# Patient Record
Sex: Male | Born: 1945
Health system: Southern US, Community
[De-identification: ages and names within clinical notes are randomized; demographics above are authoritative.]

## PROBLEM LIST (undated history)

## (undated) DIAGNOSIS — E119 Type 2 diabetes mellitus without complications: Secondary | ICD-10-CM

## (undated) DIAGNOSIS — I10 Essential (primary) hypertension: Secondary | ICD-10-CM

## (undated) DIAGNOSIS — E785 Hyperlipidemia, unspecified: Secondary | ICD-10-CM

## (undated) HISTORY — PX: COLONOSCOPY: SHX174

## (undated) HISTORY — PX: SHOULDER SURGERY: SHX246

## (undated) HISTORY — PX: POLYPECTOMY: SHX149

## (undated) HISTORY — PX: APPENDECTOMY: SHX54

## (undated) HISTORY — DX: Hyperlipidemia, unspecified: E78.5

## (undated) HISTORY — DX: Essential (primary) hypertension: I10

## (undated) HISTORY — PX: EYE SURGERY: SHX253

## (undated) HISTORY — DX: Type 2 diabetes mellitus without complications: E11.9

## (undated) HISTORY — PX: TONSILLECTOMY: SUR1361

---

## 2001-01-27 ENCOUNTER — Encounter: Payer: Self-pay | Admitting: Gastroenterology

## 2005-06-05 ENCOUNTER — Ambulatory Visit (HOSPITAL_COMMUNITY): Admission: RE | Admit: 2005-06-05 | Discharge: 2005-06-05 | Payer: Self-pay | Admitting: Thoracic Surgery

## 2006-10-12 ENCOUNTER — Encounter: Admission: RE | Admit: 2006-10-12 | Discharge: 2006-10-12 | Payer: Self-pay | Admitting: Thoracic Surgery

## 2006-10-12 ENCOUNTER — Ambulatory Visit: Payer: Self-pay | Admitting: Thoracic Surgery

## 2008-02-10 ENCOUNTER — Ambulatory Visit: Payer: Self-pay | Admitting: Gastroenterology

## 2008-02-10 DIAGNOSIS — Z860100 Personal history of colon polyps, unspecified: Secondary | ICD-10-CM | POA: Insufficient documentation

## 2008-02-10 DIAGNOSIS — E119 Type 2 diabetes mellitus without complications: Secondary | ICD-10-CM

## 2008-02-10 DIAGNOSIS — K5730 Diverticulosis of large intestine without perforation or abscess without bleeding: Secondary | ICD-10-CM | POA: Insufficient documentation

## 2008-03-15 ENCOUNTER — Encounter: Payer: Self-pay | Admitting: Gastroenterology

## 2008-03-15 ENCOUNTER — Ambulatory Visit: Payer: Self-pay | Admitting: Gastroenterology

## 2008-03-19 ENCOUNTER — Encounter: Payer: Self-pay | Admitting: Gastroenterology

## 2010-08-19 NOTE — Letter (Signed)
October 12, 2006   Gaspar Garbe, M.D.  [address]  [place], [place] [address]   Re:  Richard Singleton, Richard Singleton                DOB:  Oct 07, 1945   Dear Dr. Wylene Simmer:   I saw Richard Singleton back in the office today for a followup.  His CT scan 9  months since his last CT scan showed no change, so he has now been over  2 years with no change in the right lower lobe 8 mm nodule.  For this  reason I will refer him back to you.  For a followup, I would suggest  that he gets at least a chest x-ray once a year for the next several  years and if there is anything suspicious to repeat his CT scan.  I  appreciate the opportunity to seeing  Richard Singleton.   Ines Bloomer, M.D.  Electronically Signed   DPB/MEDQ  D:  10/12/2006  T:  10/13/2006  Job:  [id number]

## 2011-01-09 LAB — GLUCOSE, CAPILLARY: Glucose-Capillary: 121 mg/dL — ABNORMAL HIGH (ref 70–99)

## 2013-01-06 ENCOUNTER — Encounter: Payer: Self-pay | Admitting: Gastroenterology

## 2013-06-23 ENCOUNTER — Other Ambulatory Visit: Payer: Self-pay | Admitting: Internal Medicine

## 2013-06-23 ENCOUNTER — Encounter: Payer: Self-pay | Admitting: Gastroenterology

## 2013-06-23 DIAGNOSIS — R911 Solitary pulmonary nodule: Secondary | ICD-10-CM

## 2013-06-28 ENCOUNTER — Ambulatory Visit
Admission: RE | Admit: 2013-06-28 | Discharge: 2013-06-28 | Disposition: A | Discharge status: Home / Self Care | Payer: BC Managed Care – PPO | Source: Ambulatory Visit | Attending: Internal Medicine | Admitting: Internal Medicine

## 2013-06-28 ENCOUNTER — Other Ambulatory Visit: Payer: Self-pay | Admitting: Internal Medicine

## 2013-06-28 DIAGNOSIS — R911 Solitary pulmonary nodule: Secondary | ICD-10-CM

## 2013-08-16 ENCOUNTER — Ambulatory Visit (AMBULATORY_SURGERY_CENTER): Payer: Self-pay | Admitting: *Deleted

## 2013-08-16 VITALS — Ht 71.0 in | Wt 230.0 lb

## 2013-08-16 DIAGNOSIS — Z86010 Personal history of colonic polyps: Secondary | ICD-10-CM

## 2013-08-16 MED ORDER — MOVIPREP 100 G PO SOLR: ORAL | Status: DC

## 2013-08-16 NOTE — Progress Notes (Signed)
Patient denies any allergies to eggs or soy. Patient denies any problems with anesthesia/sedation. Patient denies any oxygen use at home and does not take any diet/weight loss medications. EMMI education assisgned to patient on colonoscopy. Patient does not have medication list, unsure of ALL names of pills. Patient denies being on ANY blood thinners. Encouraged patient to call us back with list or bring list of medications to colonoscopy procedure day. He verbalizes understanding.

## 2013-08-30 ENCOUNTER — Ambulatory Visit (AMBULATORY_SURGERY_CENTER): Payer: BC Managed Care – PPO | Admitting: Gastroenterology

## 2013-08-30 ENCOUNTER — Encounter: Payer: Self-pay | Admitting: Gastroenterology

## 2013-08-30 VITALS — BP 147/81 | HR 56 | Temp 97.4°F | Resp 16 | Ht 71.0 in | Wt 230.0 lb

## 2013-08-30 DIAGNOSIS — D126 Benign neoplasm of colon, unspecified: Secondary | ICD-10-CM

## 2013-08-30 DIAGNOSIS — Z86010 Personal history of colonic polyps: Secondary | ICD-10-CM

## 2013-08-30 MED ORDER — SODIUM CHLORIDE 0.9 % IV SOLN: 500.0000 mL | INTRAVENOUS | Status: DC

## 2013-08-30 NOTE — Op Note (Signed)
[place]  [address] [place], [address]   COLONOSCOPY PROCEDURE REPORT PATIENT: Richard Singleton, Urbas  MR#: [id number] BIRTHDATE: 1946-02-27 , 67  yrs. old GENDER: Male ENDOSCOPIST: Ladene Artist, MD, [facility] PROCEDURE DATE:  08/30/2013 PROCEDURE:   Colonoscopy with snare polypectomy First Screening Colonoscopy - Avg.  risk and is 50 yrs.  old or older - No.  Prior Negative Screening - Now for repeat screening. N/A  History of Adenoma - Now for follow-up colonoscopy & has been > or = to 3 yrs.  Yes hx of adenoma.  Has been 3 or more years since last colonoscopy.  Polyps Removed Today? Yes. ASA CLASS:   Class II INDICATIONS:Patient's personal history of adenomatous colon polyps.  MEDICATIONS: MAC sedation, administered by CRNA and propofol (Diprivan) 250mg  IV DESCRIPTION OF PROCEDURE:   After the risks benefits and alternatives of the procedure were thoroughly explained, informed consent was obtained.  A digital rectal exam revealed no abnormalities of the rectum.   The LB [id number]  endoscope was introduced through the anus and advanced to the cecum, which was identified by both the appendix and ileocecal valve. No adverse events experienced.   The quality of the prep was adequate, using MoviPrep  The instrument was then slowly withdrawn as the colon was fully examined.  COLON FINDINGS: A sessile polyp measuring 1.2 cm in size was found at the cecum. A piecemal polypectomy was performed using snare cautery.  The resection was complete and the polyp tissue was completely retrieved.  Four sessile polyps measuring 6-8 mm in size were found in the transverse colon. A polypectomy was performed with a cold snare for 3 and using snare cautery for 1. Mild diverticulosis was noted in the sigmoid colon.  The colon was otherwise normal.  There was no diverticulosis, inflammation, polyps or cancers unless previously stated. Retroflexed views revealed internal  hemorrhoids. The time to cecum=2 minutes 59 seconds.  Withdrawal time=19 minutes 47 seconds.  The scope was withdrawn and the procedure completed. COMPLICATIONS: There were no complications. ENDOSCOPIC IMPRESSION: 1.   Sessile polyp measuring 1.2 cm at the cecum; piecemeal polypectomy was performed using snare cautery 2.   Four sessile polyps measuring 6-8 mm in the transverse colon; polypectomy performed with cold snare, snare cautery 3.   Mild diverticulosis in the sigmoid colon 4.   Small internal hemorrhoids RECOMMENDATIONS: 1.  Hold aspirin, aspirin products, and anti-inflammatory medication for 2 weeks. 2.  Await pathology results 3.  High fiber diet with liberal fluid intake. 4.  Repeat Colonoscopy in 1 year with a better bowel prep to assess if cecal polypectomy was complete  eSigned:  Ladene Artist, MD, [facility] 08/30/2013 11:35 AM   cc: Domenick Gong, MD

## 2013-08-30 NOTE — Patient Instructions (Addendum)
HOLD ASPIRIN, ASPIRIN PRODUCTS AND NSAIDS,(MOTRIN,ALEVE, ADVIL, IBUPROFEN) UNTIL MAY 10,2015    YOU HAD AN ENDOSCOPIC PROCEDURE TODAY AT [facility]: Refer to the procedure report that was given to you for any specific questions about what was found during the examination.  If the procedure report does not answer your questions, please call your gastroenterologist to clarify.  If you requested that your care partner not be given the details of your procedure findings, then the procedure report has been included in a sealed envelope for you to review at your convenience later.  YOU SHOULD EXPECT: Some feelings of bloating in the abdomen. Passage of more gas than usual.  Walking can help get rid of the air that was put into your GI tract during the procedure and reduce the bloating. If you had a lower endoscopy (such as a colonoscopy or flexible sigmoidoscopy) you may notice spotting of blood in your stool or on the toilet paper. If you underwent a bowel prep for your procedure, then you may not have a normal bowel movement for a few days.  DIET: Your first meal following the procedure should be a light meal and then it is ok to progress to your normal diet.  A half-sandwich or bowl of soup is an example of a good first meal.  Heavy or fried foods are harder to digest and may make you feel nauseous or bloated.  Likewise meals heavy in dairy and vegetables can cause extra gas to form and this can also increase the bloating.  Drink plenty of fluids but you should avoid alcoholic beverages for 24 hours.  ACTIVITY: Your care partner should take you home directly after the procedure.  You should plan to take it easy, moving slowly for the rest of the day.  You can resume normal activity the day after the procedure however you should NOT DRIVE or use heavy machinery for 24 hours (because of the sedation medicines used during the test).    SYMPTOMS TO REPORT IMMEDIATELY: A gastroenterologist can  be reached at any hour.  During normal business hours, 8:30 AM to 5:00 PM Monday through Friday, call [phone].  After hours and on weekends, please call the GI answering service at [phone] who will take a message and have the physician on call contact you.   Following lower endoscopy (colonoscopy or flexible sigmoidoscopy):  Excessive amounts of blood in the stool  Significant tenderness or worsening of abdominal pains  Swelling of the abdomen that is new, acute  Fever of 100F or higher FOLLOW UP: If any biopsies were taken you will be contacted by phone or by letter within the next 1-3 weeks.  Call your gastroenterologist if you have not heard about the biopsies in 3 weeks.  Our staff will call the home number listed on your records the next business day following your procedure to check on you and address any questions or concerns that you may have at that time regarding the information given to you following your procedure. This is a courtesy call and so if there is no answer at the home number and we have not heard from you through the emergency physician on call, we will assume that you have returned to your regular daily activities without incident.  SIGNATURES/CONFIDENTIALITY: You and/or your care partner have signed paperwork which will be entered into your electronic medical record.  These signatures attest to the fact that that the information above on your After Visit Summary has  been reviewed and is understood.  Full responsibility of the confidentiality of this discharge information lies with you and/or your care-partner.

## 2013-08-30 NOTE — Progress Notes (Signed)
Called to room to assist during endoscopic procedure.  Patient ID and intended procedure confirmed with present staff. Received instructions for my participation in the procedure from the performing physician.  

## 2013-08-30 NOTE — Progress Notes (Signed)
Report to PACU, RN, vss, BBS= Clear.  

## 2013-08-31 ENCOUNTER — Telehealth: Payer: Self-pay | Admitting: *Deleted

## 2013-08-31 NOTE — Telephone Encounter (Signed)
Message left

## 2013-09-08 ENCOUNTER — Encounter: Payer: Self-pay | Admitting: Gastroenterology

## 2014-07-10 ENCOUNTER — Encounter: Payer: Self-pay | Admitting: Gastroenterology

## 2014-07-17 ENCOUNTER — Encounter: Payer: Self-pay | Admitting: Gastroenterology

## 2014-08-21 ENCOUNTER — Ambulatory Visit (AMBULATORY_SURGERY_CENTER): Payer: Self-pay | Admitting: *Deleted

## 2014-08-21 VITALS — Ht 71.0 in | Wt 234.0 lb

## 2014-08-21 DIAGNOSIS — Z86010 Personal history of colonic polyps: Secondary | ICD-10-CM

## 2014-08-21 MED ORDER — NA SULFATE-K SULFATE-MG SULF 17.5-3.13-1.6 GM/177ML PO SOLN: 1.0000 | Freq: Once | ORAL | Status: DC

## 2014-08-21 NOTE — Progress Notes (Signed)
No egg or soy all;ergy No diet pills No home 02 use No issues with past sedation

## 2014-09-04 ENCOUNTER — Encounter: Payer: Self-pay | Admitting: Gastroenterology

## 2014-09-04 ENCOUNTER — Ambulatory Visit (AMBULATORY_SURGERY_CENTER): Payer: BLUE CROSS/BLUE SHIELD | Admitting: Gastroenterology

## 2014-09-04 VITALS — BP 110/57 | HR 55 | Temp 97.0°F | Resp 29 | Ht 71.0 in | Wt 230.0 lb

## 2014-09-04 DIAGNOSIS — D120 Benign neoplasm of cecum: Secondary | ICD-10-CM

## 2014-09-04 DIAGNOSIS — D124 Benign neoplasm of descending colon: Secondary | ICD-10-CM | POA: Diagnosis not present

## 2014-09-04 DIAGNOSIS — Z86010 Personal history of colonic polyps: Secondary | ICD-10-CM

## 2014-09-04 DIAGNOSIS — D123 Benign neoplasm of transverse colon: Secondary | ICD-10-CM

## 2014-09-04 MED ORDER — SODIUM CHLORIDE 0.9 % IV SOLN: 500.0000 mL | INTRAVENOUS | Status: DC

## 2014-09-04 NOTE — Progress Notes (Signed)
Called to room to assist during endoscopic procedure.  Patient ID and intended procedure confirmed with present staff. Received instructions for my participation in the procedure from the performing physician.  

## 2014-09-04 NOTE — Progress Notes (Signed)
Report to PACU, RN, vss, BBS= Clear.  

## 2014-09-04 NOTE — Patient Instructions (Signed)
YOU HAD AN ENDOSCOPIC PROCEDURE TODAY AT [facility]:   Refer to the procedure report that was given to you for any specific questions about what was found during the examination.  If the procedure report does not answer your questions, please call your gastroenterologist to clarify.  If you requested that your care partner not be given the details of your procedure findings, then the procedure report has been included in a sealed envelope for you to review at your convenience later.  YOU SHOULD EXPECT: Some feelings of bloating in the abdomen. Passage of more gas than usual.  Walking can help get rid of the air that was put into your GI tract during the procedure and reduce the bloating. If you had a lower endoscopy (such as a colonoscopy or flexible sigmoidoscopy) you may notice spotting of blood in your stool or on the toilet paper. If you underwent a bowel prep for your procedure, you may not have a normal bowel movement for a few days.  Please Note:  You might notice some irritation and congestion in your nose or some drainage.  This is from the oxygen used during your procedure.  There is no need for concern and it should clear up in a day or so.  SYMPTOMS TO REPORT IMMEDIATELY:   Following lower endoscopy (colonoscopy or flexible sigmoidoscopy):  Excessive amounts of blood in the stool  Significant tenderness or worsening of abdominal pains  Swelling of the abdomen that is new, acute  Fever of 100F or higher    For urgent or emergent issues, a gastroenterologist can be reached at any hour by calling [phone].   DIET: Your first meal following the procedure should be a small meal and then it is ok to progress to your normal diet. Heavy or fried foods are harder to digest and may make you feel nauseous or bloated.  Likewise, meals heavy in dairy and vegetables can increase bloating.  Drink plenty of fluids but you should avoid alcoholic beverages for 24  hours.  ACTIVITY:  You should plan to take it easy for the rest of today and you should NOT DRIVE or use heavy machinery until tomorrow (because of the sedation medicines used during the test).    FOLLOW UP: Our staff will call the number listed on your records the next business day following your procedure to check on you and address any questions or concerns that you may have regarding the information given to you following your procedure. If we do not reach you, we will leave a message.  However, if you are feeling well and you are not experiencing any problems, there is no need to return our call.  We will assume that you have returned to your regular daily activities without incident.  If any biopsies were taken you will be contacted by phone or by letter within the next 1-3 weeks.  Please call us at [phone] if you have not heard about the biopsies in 3 weeks.    SIGNATURES/CONFIDENTIALITY: You and/or your care partner have signed paperwork which will be entered into your electronic medical record.  These signatures attest to the fact that that the information above on your After Visit Summary has been reviewed and is understood.  Full responsibility of the confidentiality of this discharge information lies with you and/or your care-partner.  Polyp, diverticulosis, high fiber diet, and hemorrhoid information given.  Liberal fluid intake encouraged.

## 2014-09-04 NOTE — Op Note (Addendum)
[place]  [address] [place], [address]   COLONOSCOPY PROCEDURE REPORT  PATIENT: Richard Singleton, Richard Singleton  MR#: [id number] BIRTHDATE: Jul 07, 1945 , 68  yrs. old GENDER: male ENDOSCOPIST: Ladene Artist, MD, [facility] PROCEDURE DATE:  09/04/2014 PROCEDURE:   Colonoscopy, surveillance and Colonoscopy with snare polypectomy First Screening Colonoscopy - Avg.  risk and is 50 yrs.  old or older - No.  Prior Negative Screening - Now for repeat screening. N/A  History of Adenoma - Now for follow-up colonoscopy & has been > or = to 3 yrs.  No.  It has been less than 3 yrs since last colonoscopy.  Medical reason.  Polyps removed today? Yes ASA CLASS:   Class II INDICATIONS:Surveillance due to prior colonic neoplasia and PH Colon Adenoma. MEDICATIONS: Monitored anesthesia care and Propofol 250 mg IV DESCRIPTION OF PROCEDURE:   After the risks benefits and alternatives of the procedure were thoroughly explained, informed consent was obtained.  The digital rectal exam revealed no abnormalities of the rectum.   The LB Olympus Loaner [id number] endoscope was introduced through the anus and advanced to the cecum, which was identified by both the appendix and ileocecal valve. No adverse events experienced.   The quality of the prep was good.  (Suprep was used)  The instrument was then slowly withdrawn as the colon was fully examined. Estimated blood loss is zero unless otherwise noted in this procedure report.    COLON FINDINGS: A sessile polyp measuring 6 mm in size was found at the cecum.  A polypectomy was performed with a cold snare.  The resection was complete, the polyp tissue was completely retrieved and sent to histology.   Two sessile polyps measuring 6-8 mm in size were found in the transverse colon.  Polypectomies were performed with a cold snare.  The resection was complete, the polyp tissue was completely retrieved and sent to histology.   A sessile polyp measuring 7  mm in size was found in the descending colon.  A polypectomy was performed with a cold snare.  The resection was complete, the polyp tissue was completely retrieved and sent to histology.   There was mild diverticulosis noted in the sigmoid colon.   The examination was otherwise normal.  Retroflexed views revealed internal Grade I hemorrhoids. The time to cecum = 2.5 Withdrawal time = 13.6   The scope was withdrawn and the procedure completed.  COMPLICATIONS: There were no immediate complications.   ENDOSCOPIC IMPRESSION: 1.   Sessile polyp at the cecum; polypectomy performed with a cold snare 2.   Two sessile polyps in the transverse colon; polypectomies performed with a cold snare 3.   Sessile polyp in the descending colon; polypectomy performed with a cold snare 4.   Mild diverticulosis in the sigmoid colon 5.   Grade l internal hemorrhoids  RECOMMENDATIONS: 1.  Await pathology results 2.  High fiber diet with liberal fluid intake. 3.  Repeat Colonoscopy in 3 years.  eSigned:  Ladene Artist, MD, [facility] 09/04/2014 11:32 AM   cc: Domenick Gong, MD   PATIENT NAME:  Richard Singleton, Richard Singleton MR#: [id number]

## 2014-09-05 ENCOUNTER — Telehealth: Payer: Self-pay | Admitting: *Deleted

## 2014-09-05 NOTE — Telephone Encounter (Signed)
  Follow up Call-  Call back number 09/04/2014 08/30/2013  Post procedure Call Back phone  # [phone]  Permission to leave phone message Yes Yes     No answer, left message.

## 2014-09-14 ENCOUNTER — Encounter: Payer: Self-pay | Admitting: Gastroenterology

## 2015-08-07 DIAGNOSIS — E119 Type 2 diabetes mellitus without complications: Secondary | ICD-10-CM | POA: Diagnosis not present

## 2015-08-07 DIAGNOSIS — Z6832 Body mass index (BMI) 32.0-32.9, adult: Secondary | ICD-10-CM | POA: Diagnosis not present

## 2015-08-07 DIAGNOSIS — I10 Essential (primary) hypertension: Secondary | ICD-10-CM | POA: Diagnosis not present

## 2015-10-23 DIAGNOSIS — E119 Type 2 diabetes mellitus without complications: Secondary | ICD-10-CM | POA: Diagnosis not present

## 2015-10-23 DIAGNOSIS — Z0000 Encounter for general adult medical examination without abnormal findings: Secondary | ICD-10-CM | POA: Diagnosis not present

## 2015-10-23 DIAGNOSIS — Z125 Encounter for screening for malignant neoplasm of prostate: Secondary | ICD-10-CM | POA: Diagnosis not present

## 2015-10-23 DIAGNOSIS — I10 Essential (primary) hypertension: Secondary | ICD-10-CM | POA: Diagnosis not present

## 2015-10-30 DIAGNOSIS — Z0000 Encounter for general adult medical examination without abnormal findings: Secondary | ICD-10-CM | POA: Diagnosis not present

## 2015-10-30 DIAGNOSIS — E7800 Pure hypercholesterolemia, unspecified: Secondary | ICD-10-CM | POA: Diagnosis not present

## 2015-10-30 DIAGNOSIS — Z1389 Encounter for screening for other disorder: Secondary | ICD-10-CM | POA: Diagnosis not present

## 2015-10-30 DIAGNOSIS — E119 Type 2 diabetes mellitus without complications: Secondary | ICD-10-CM | POA: Diagnosis not present

## 2015-10-30 DIAGNOSIS — B001 Herpesviral vesicular dermatitis: Secondary | ICD-10-CM | POA: Diagnosis not present

## 2015-10-30 DIAGNOSIS — I10 Essential (primary) hypertension: Secondary | ICD-10-CM | POA: Diagnosis not present

## 2015-10-30 DIAGNOSIS — E668 Other obesity: Secondary | ICD-10-CM | POA: Diagnosis not present

## 2015-10-30 DIAGNOSIS — Z125 Encounter for screening for malignant neoplasm of prostate: Secondary | ICD-10-CM | POA: Diagnosis not present

## 2015-10-30 DIAGNOSIS — Z6832 Body mass index (BMI) 32.0-32.9, adult: Secondary | ICD-10-CM | POA: Diagnosis not present

## 2015-10-30 DIAGNOSIS — Z794 Long term (current) use of insulin: Secondary | ICD-10-CM | POA: Diagnosis not present

## 2015-10-30 DIAGNOSIS — N529 Male erectile dysfunction, unspecified: Secondary | ICD-10-CM | POA: Diagnosis not present

## 2015-10-30 DIAGNOSIS — D126 Benign neoplasm of colon, unspecified: Secondary | ICD-10-CM | POA: Diagnosis not present

## 2015-11-01 DIAGNOSIS — Z1212 Encounter for screening for malignant neoplasm of rectum: Secondary | ICD-10-CM | POA: Diagnosis not present

## 2015-11-01 LAB — IFOBT (OCCULT BLOOD): IFOBT: POSITIVE

## 2015-12-10 DIAGNOSIS — L814 Other melanin hyperpigmentation: Secondary | ICD-10-CM | POA: Diagnosis not present

## 2015-12-10 DIAGNOSIS — D1800 Hemangioma unspecified site: Secondary | ICD-10-CM | POA: Diagnosis not present

## 2015-12-10 DIAGNOSIS — Z86018 Personal history of other benign neoplasm: Secondary | ICD-10-CM | POA: Diagnosis not present

## 2015-12-10 DIAGNOSIS — L570 Actinic keratosis: Secondary | ICD-10-CM | POA: Diagnosis not present

## 2015-12-10 DIAGNOSIS — L719 Rosacea, unspecified: Secondary | ICD-10-CM | POA: Diagnosis not present

## 2015-12-10 DIAGNOSIS — L821 Other seborrheic keratosis: Secondary | ICD-10-CM | POA: Diagnosis not present

## 2015-12-10 DIAGNOSIS — B356 Tinea cruris: Secondary | ICD-10-CM | POA: Diagnosis not present

## 2015-12-19 DIAGNOSIS — E119 Type 2 diabetes mellitus without complications: Secondary | ICD-10-CM | POA: Diagnosis not present

## 2015-12-19 DIAGNOSIS — Z23 Encounter for immunization: Secondary | ICD-10-CM | POA: Diagnosis not present

## 2015-12-19 DIAGNOSIS — I10 Essential (primary) hypertension: Secondary | ICD-10-CM | POA: Diagnosis not present

## 2015-12-19 DIAGNOSIS — Z6832 Body mass index (BMI) 32.0-32.9, adult: Secondary | ICD-10-CM | POA: Diagnosis not present

## 2016-03-25 DIAGNOSIS — E119 Type 2 diabetes mellitus without complications: Secondary | ICD-10-CM | POA: Diagnosis not present

## 2016-03-25 DIAGNOSIS — I10 Essential (primary) hypertension: Secondary | ICD-10-CM | POA: Diagnosis not present

## 2016-04-30 DIAGNOSIS — E668 Other obesity: Secondary | ICD-10-CM | POA: Diagnosis not present

## 2016-04-30 DIAGNOSIS — I10 Essential (primary) hypertension: Secondary | ICD-10-CM | POA: Diagnosis not present

## 2016-04-30 DIAGNOSIS — Z794 Long term (current) use of insulin: Secondary | ICD-10-CM | POA: Diagnosis not present

## 2016-04-30 DIAGNOSIS — E119 Type 2 diabetes mellitus without complications: Secondary | ICD-10-CM | POA: Diagnosis not present

## 2016-04-30 DIAGNOSIS — Z6831 Body mass index (BMI) 31.0-31.9, adult: Secondary | ICD-10-CM | POA: Diagnosis not present

## 2016-04-30 DIAGNOSIS — E7800 Pure hypercholesterolemia, unspecified: Secondary | ICD-10-CM | POA: Diagnosis not present

## 2016-07-01 DIAGNOSIS — E119 Type 2 diabetes mellitus without complications: Secondary | ICD-10-CM | POA: Diagnosis not present

## 2016-07-01 DIAGNOSIS — Z6832 Body mass index (BMI) 32.0-32.9, adult: Secondary | ICD-10-CM | POA: Diagnosis not present

## 2016-07-01 DIAGNOSIS — I10 Essential (primary) hypertension: Secondary | ICD-10-CM | POA: Diagnosis not present

## 2016-07-15 DIAGNOSIS — E119 Type 2 diabetes mellitus without complications: Secondary | ICD-10-CM | POA: Diagnosis not present

## 2016-08-14 DIAGNOSIS — E119 Type 2 diabetes mellitus without complications: Secondary | ICD-10-CM | POA: Diagnosis not present

## 2016-08-14 DIAGNOSIS — H02831 Dermatochalasis of right upper eyelid: Secondary | ICD-10-CM | POA: Diagnosis not present

## 2016-08-14 DIAGNOSIS — H02834 Dermatochalasis of left upper eyelid: Secondary | ICD-10-CM | POA: Diagnosis not present

## 2016-08-14 DIAGNOSIS — H2513 Age-related nuclear cataract, bilateral: Secondary | ICD-10-CM | POA: Diagnosis not present

## 2016-08-14 DIAGNOSIS — H02403 Unspecified ptosis of bilateral eyelids: Secondary | ICD-10-CM | POA: Diagnosis not present

## 2016-08-20 DIAGNOSIS — H0279 Other degenerative disorders of eyelid and periocular area: Secondary | ICD-10-CM | POA: Diagnosis not present

## 2016-08-20 DIAGNOSIS — H53483 Generalized contraction of visual field, bilateral: Secondary | ICD-10-CM | POA: Diagnosis not present

## 2016-08-20 DIAGNOSIS — H02831 Dermatochalasis of right upper eyelid: Secondary | ICD-10-CM | POA: Diagnosis not present

## 2016-08-20 DIAGNOSIS — H02423 Myogenic ptosis of bilateral eyelids: Secondary | ICD-10-CM | POA: Diagnosis not present

## 2016-08-20 DIAGNOSIS — H02413 Mechanical ptosis of bilateral eyelids: Secondary | ICD-10-CM | POA: Diagnosis not present

## 2016-08-20 DIAGNOSIS — H02834 Dermatochalasis of left upper eyelid: Secondary | ICD-10-CM | POA: Diagnosis not present

## 2016-09-15 DIAGNOSIS — Z6829 Body mass index (BMI) 29.0-29.9, adult: Secondary | ICD-10-CM | POA: Diagnosis not present

## 2016-09-15 DIAGNOSIS — I10 Essential (primary) hypertension: Secondary | ICD-10-CM | POA: Diagnosis not present

## 2016-09-15 DIAGNOSIS — E119 Type 2 diabetes mellitus without complications: Secondary | ICD-10-CM | POA: Diagnosis not present

## 2016-10-08 DIAGNOSIS — M25511 Pain in right shoulder: Secondary | ICD-10-CM | POA: Diagnosis not present

## 2016-10-08 DIAGNOSIS — Z6829 Body mass index (BMI) 29.0-29.9, adult: Secondary | ICD-10-CM | POA: Diagnosis not present

## 2016-10-13 DIAGNOSIS — M25511 Pain in right shoulder: Secondary | ICD-10-CM | POA: Diagnosis not present

## 2016-10-16 DIAGNOSIS — M25511 Pain in right shoulder: Secondary | ICD-10-CM | POA: Diagnosis not present

## 2016-10-19 DIAGNOSIS — M25511 Pain in right shoulder: Secondary | ICD-10-CM | POA: Diagnosis not present

## 2016-10-28 DIAGNOSIS — I10 Essential (primary) hypertension: Secondary | ICD-10-CM | POA: Diagnosis not present

## 2016-10-28 DIAGNOSIS — Z125 Encounter for screening for malignant neoplasm of prostate: Secondary | ICD-10-CM | POA: Diagnosis not present

## 2016-10-28 DIAGNOSIS — E7800 Pure hypercholesterolemia, unspecified: Secondary | ICD-10-CM | POA: Diagnosis not present

## 2016-10-28 DIAGNOSIS — E119 Type 2 diabetes mellitus without complications: Secondary | ICD-10-CM | POA: Diagnosis not present

## 2016-11-02 DIAGNOSIS — M25511 Pain in right shoulder: Secondary | ICD-10-CM | POA: Diagnosis not present

## 2016-11-04 DIAGNOSIS — M25511 Pain in right shoulder: Secondary | ICD-10-CM | POA: Diagnosis not present

## 2016-11-05 DIAGNOSIS — Z6828 Body mass index (BMI) 28.0-28.9, adult: Secondary | ICD-10-CM | POA: Diagnosis not present

## 2016-11-05 DIAGNOSIS — E119 Type 2 diabetes mellitus without complications: Secondary | ICD-10-CM | POA: Diagnosis not present

## 2016-11-05 DIAGNOSIS — E668 Other obesity: Secondary | ICD-10-CM | POA: Diagnosis not present

## 2016-11-05 DIAGNOSIS — J984 Other disorders of lung: Secondary | ICD-10-CM | POA: Diagnosis not present

## 2016-11-05 DIAGNOSIS — D126 Benign neoplasm of colon, unspecified: Secondary | ICD-10-CM | POA: Diagnosis not present

## 2016-11-05 DIAGNOSIS — Z87891 Personal history of nicotine dependence: Secondary | ICD-10-CM | POA: Diagnosis not present

## 2016-11-05 DIAGNOSIS — M19011 Primary osteoarthritis, right shoulder: Secondary | ICD-10-CM | POA: Diagnosis not present

## 2016-11-05 DIAGNOSIS — I10 Essential (primary) hypertension: Secondary | ICD-10-CM | POA: Diagnosis not present

## 2016-11-05 DIAGNOSIS — E7800 Pure hypercholesterolemia, unspecified: Secondary | ICD-10-CM | POA: Diagnosis not present

## 2016-11-05 DIAGNOSIS — Z23 Encounter for immunization: Secondary | ICD-10-CM | POA: Diagnosis not present

## 2016-11-05 DIAGNOSIS — Z0000 Encounter for general adult medical examination without abnormal findings: Secondary | ICD-10-CM | POA: Diagnosis not present

## 2016-11-05 DIAGNOSIS — Z1389 Encounter for screening for other disorder: Secondary | ICD-10-CM | POA: Diagnosis not present

## 2016-11-06 DIAGNOSIS — Z1212 Encounter for screening for malignant neoplasm of rectum: Secondary | ICD-10-CM | POA: Diagnosis not present

## 2016-11-09 DIAGNOSIS — M25511 Pain in right shoulder: Secondary | ICD-10-CM | POA: Diagnosis not present

## 2016-11-11 DIAGNOSIS — M25511 Pain in right shoulder: Secondary | ICD-10-CM | POA: Diagnosis not present

## 2016-11-13 DIAGNOSIS — M25511 Pain in right shoulder: Secondary | ICD-10-CM | POA: Diagnosis not present

## 2016-11-16 DIAGNOSIS — M25511 Pain in right shoulder: Secondary | ICD-10-CM | POA: Diagnosis not present

## 2016-11-20 DIAGNOSIS — M25511 Pain in right shoulder: Secondary | ICD-10-CM | POA: Diagnosis not present

## 2016-11-23 DIAGNOSIS — M25511 Pain in right shoulder: Secondary | ICD-10-CM | POA: Diagnosis not present

## 2016-11-26 DIAGNOSIS — H02831 Dermatochalasis of right upper eyelid: Secondary | ICD-10-CM | POA: Diagnosis not present

## 2016-11-26 DIAGNOSIS — H02834 Dermatochalasis of left upper eyelid: Secondary | ICD-10-CM | POA: Diagnosis not present

## 2016-11-26 DIAGNOSIS — H02413 Mechanical ptosis of bilateral eyelids: Secondary | ICD-10-CM | POA: Diagnosis not present

## 2016-12-02 DIAGNOSIS — M25511 Pain in right shoulder: Secondary | ICD-10-CM | POA: Diagnosis not present

## 2016-12-02 DIAGNOSIS — G8929 Other chronic pain: Secondary | ICD-10-CM | POA: Diagnosis not present

## 2016-12-04 DIAGNOSIS — M25511 Pain in right shoulder: Secondary | ICD-10-CM | POA: Diagnosis not present

## 2016-12-04 DIAGNOSIS — G8929 Other chronic pain: Secondary | ICD-10-CM | POA: Diagnosis not present

## 2016-12-10 DIAGNOSIS — Z86018 Personal history of other benign neoplasm: Secondary | ICD-10-CM | POA: Diagnosis not present

## 2016-12-10 DIAGNOSIS — M75121 Complete rotator cuff tear or rupture of right shoulder, not specified as traumatic: Secondary | ICD-10-CM | POA: Diagnosis not present

## 2016-12-10 DIAGNOSIS — D1800 Hemangioma unspecified site: Secondary | ICD-10-CM | POA: Diagnosis not present

## 2016-12-10 DIAGNOSIS — L570 Actinic keratosis: Secondary | ICD-10-CM | POA: Diagnosis not present

## 2016-12-10 DIAGNOSIS — D485 Neoplasm of uncertain behavior of skin: Secondary | ICD-10-CM | POA: Diagnosis not present

## 2016-12-10 DIAGNOSIS — L814 Other melanin hyperpigmentation: Secondary | ICD-10-CM | POA: Diagnosis not present

## 2016-12-10 DIAGNOSIS — L820 Inflamed seborrheic keratosis: Secondary | ICD-10-CM | POA: Diagnosis not present

## 2016-12-10 DIAGNOSIS — L821 Other seborrheic keratosis: Secondary | ICD-10-CM | POA: Diagnosis not present

## 2016-12-10 DIAGNOSIS — L719 Rosacea, unspecified: Secondary | ICD-10-CM | POA: Diagnosis not present

## 2016-12-16 DIAGNOSIS — Z6827 Body mass index (BMI) 27.0-27.9, adult: Secondary | ICD-10-CM | POA: Diagnosis not present

## 2016-12-16 DIAGNOSIS — Z23 Encounter for immunization: Secondary | ICD-10-CM | POA: Diagnosis not present

## 2016-12-16 DIAGNOSIS — E669 Obesity, unspecified: Secondary | ICD-10-CM | POA: Diagnosis not present

## 2016-12-16 DIAGNOSIS — E119 Type 2 diabetes mellitus without complications: Secondary | ICD-10-CM | POA: Diagnosis not present

## 2016-12-16 DIAGNOSIS — I10 Essential (primary) hypertension: Secondary | ICD-10-CM | POA: Diagnosis not present

## 2016-12-22 DIAGNOSIS — G8918 Other acute postprocedural pain: Secondary | ICD-10-CM | POA: Diagnosis not present

## 2016-12-22 DIAGNOSIS — M24111 Other articular cartilage disorders, right shoulder: Secondary | ICD-10-CM | POA: Diagnosis not present

## 2016-12-22 DIAGNOSIS — M75121 Complete rotator cuff tear or rupture of right shoulder, not specified as traumatic: Secondary | ICD-10-CM | POA: Diagnosis not present

## 2016-12-22 DIAGNOSIS — M19011 Primary osteoarthritis, right shoulder: Secondary | ICD-10-CM | POA: Diagnosis not present

## 2016-12-22 DIAGNOSIS — M7541 Impingement syndrome of right shoulder: Secondary | ICD-10-CM | POA: Diagnosis not present

## 2016-12-22 DIAGNOSIS — M7501 Adhesive capsulitis of right shoulder: Secondary | ICD-10-CM | POA: Diagnosis not present

## 2016-12-30 DIAGNOSIS — Z4789 Encounter for other orthopedic aftercare: Secondary | ICD-10-CM | POA: Diagnosis not present

## 2017-01-27 DIAGNOSIS — M75121 Complete rotator cuff tear or rupture of right shoulder, not specified as traumatic: Secondary | ICD-10-CM | POA: Diagnosis not present

## 2017-01-27 DIAGNOSIS — Z4789 Encounter for other orthopedic aftercare: Secondary | ICD-10-CM | POA: Diagnosis not present

## 2017-02-01 DIAGNOSIS — M75121 Complete rotator cuff tear or rupture of right shoulder, not specified as traumatic: Secondary | ICD-10-CM | POA: Diagnosis not present

## 2017-02-09 DIAGNOSIS — Z6827 Body mass index (BMI) 27.0-27.9, adult: Secondary | ICD-10-CM | POA: Diagnosis not present

## 2017-02-09 DIAGNOSIS — I10 Essential (primary) hypertension: Secondary | ICD-10-CM | POA: Diagnosis not present

## 2017-02-09 DIAGNOSIS — M75121 Complete rotator cuff tear or rupture of right shoulder, not specified as traumatic: Secondary | ICD-10-CM | POA: Diagnosis not present

## 2017-02-09 DIAGNOSIS — M799 Soft tissue disorder, unspecified: Secondary | ICD-10-CM | POA: Diagnosis not present

## 2017-02-09 DIAGNOSIS — E119 Type 2 diabetes mellitus without complications: Secondary | ICD-10-CM | POA: Diagnosis not present

## 2017-02-10 DIAGNOSIS — R220 Localized swelling, mass and lump, head: Secondary | ICD-10-CM | POA: Diagnosis not present

## 2017-02-11 ENCOUNTER — Ambulatory Visit
Admission: RE | Admit: 2017-02-11 | Discharge: 2017-02-11 | Disposition: A | Discharge status: Home / Self Care | Payer: Medicare Other | Source: Ambulatory Visit | Attending: Otolaryngology | Admitting: Otolaryngology

## 2017-02-11 ENCOUNTER — Other Ambulatory Visit: Payer: Self-pay | Admitting: Otolaryngology

## 2017-02-11 DIAGNOSIS — R221 Localized swelling, mass and lump, neck: Secondary | ICD-10-CM | POA: Diagnosis not present

## 2017-02-11 DIAGNOSIS — R220 Localized swelling, mass and lump, head: Secondary | ICD-10-CM

## 2017-02-11 MED ORDER — IOPAMIDOL (ISOVUE-300) INJECTION 61%
75.0000 mL | Freq: Once | INTRAVENOUS | Status: AC | PRN
  Administered 2017-02-11: 75 mL via INTRAVENOUS

## 2017-02-17 DIAGNOSIS — M75121 Complete rotator cuff tear or rupture of right shoulder, not specified as traumatic: Secondary | ICD-10-CM | POA: Diagnosis not present

## 2017-02-24 DIAGNOSIS — Z4789 Encounter for other orthopedic aftercare: Secondary | ICD-10-CM | POA: Diagnosis not present

## 2017-02-24 DIAGNOSIS — M75121 Complete rotator cuff tear or rupture of right shoulder, not specified as traumatic: Secondary | ICD-10-CM | POA: Diagnosis not present

## 2017-03-01 DIAGNOSIS — M75121 Complete rotator cuff tear or rupture of right shoulder, not specified as traumatic: Secondary | ICD-10-CM | POA: Diagnosis not present

## 2017-03-09 DIAGNOSIS — I10 Essential (primary) hypertension: Secondary | ICD-10-CM | POA: Diagnosis not present

## 2017-03-09 DIAGNOSIS — E119 Type 2 diabetes mellitus without complications: Secondary | ICD-10-CM | POA: Diagnosis not present

## 2017-03-09 DIAGNOSIS — Z6827 Body mass index (BMI) 27.0-27.9, adult: Secondary | ICD-10-CM | POA: Diagnosis not present

## 2017-03-10 DIAGNOSIS — M75121 Complete rotator cuff tear or rupture of right shoulder, not specified as traumatic: Secondary | ICD-10-CM | POA: Diagnosis not present

## 2017-03-17 DIAGNOSIS — M75121 Complete rotator cuff tear or rupture of right shoulder, not specified as traumatic: Secondary | ICD-10-CM | POA: Diagnosis not present

## 2017-03-24 DIAGNOSIS — Z4789 Encounter for other orthopedic aftercare: Secondary | ICD-10-CM | POA: Diagnosis not present

## 2017-03-24 DIAGNOSIS — M75121 Complete rotator cuff tear or rupture of right shoulder, not specified as traumatic: Secondary | ICD-10-CM | POA: Diagnosis not present

## 2017-04-07 DIAGNOSIS — M75121 Complete rotator cuff tear or rupture of right shoulder, not specified as traumatic: Secondary | ICD-10-CM | POA: Diagnosis not present

## 2017-04-14 DIAGNOSIS — M75121 Complete rotator cuff tear or rupture of right shoulder, not specified as traumatic: Secondary | ICD-10-CM | POA: Diagnosis not present

## 2017-04-21 DIAGNOSIS — M75121 Complete rotator cuff tear or rupture of right shoulder, not specified as traumatic: Secondary | ICD-10-CM | POA: Diagnosis not present

## 2017-04-28 DIAGNOSIS — M75121 Complete rotator cuff tear or rupture of right shoulder, not specified as traumatic: Secondary | ICD-10-CM | POA: Diagnosis not present

## 2017-06-24 DIAGNOSIS — E663 Overweight: Secondary | ICD-10-CM | POA: Diagnosis not present

## 2017-06-24 DIAGNOSIS — Z6827 Body mass index (BMI) 27.0-27.9, adult: Secondary | ICD-10-CM | POA: Diagnosis not present

## 2017-06-24 DIAGNOSIS — E119 Type 2 diabetes mellitus without complications: Secondary | ICD-10-CM | POA: Diagnosis not present

## 2017-06-24 DIAGNOSIS — E7800 Pure hypercholesterolemia, unspecified: Secondary | ICD-10-CM | POA: Diagnosis not present

## 2017-06-24 DIAGNOSIS — I10 Essential (primary) hypertension: Secondary | ICD-10-CM | POA: Diagnosis not present

## 2017-06-24 DIAGNOSIS — D126 Benign neoplasm of colon, unspecified: Secondary | ICD-10-CM | POA: Diagnosis not present

## 2017-06-24 DIAGNOSIS — M19011 Primary osteoarthritis, right shoulder: Secondary | ICD-10-CM | POA: Diagnosis not present

## 2017-07-08 DIAGNOSIS — I10 Essential (primary) hypertension: Secondary | ICD-10-CM | POA: Diagnosis not present

## 2017-07-08 DIAGNOSIS — E119 Type 2 diabetes mellitus without complications: Secondary | ICD-10-CM | POA: Diagnosis not present

## 2017-07-08 DIAGNOSIS — Z6827 Body mass index (BMI) 27.0-27.9, adult: Secondary | ICD-10-CM | POA: Diagnosis not present

## 2017-07-16 DIAGNOSIS — E119 Type 2 diabetes mellitus without complications: Secondary | ICD-10-CM | POA: Diagnosis not present

## 2017-07-27 ENCOUNTER — Encounter: Payer: Self-pay | Admitting: Gastroenterology

## 2017-08-03 ENCOUNTER — Encounter: Payer: Self-pay | Admitting: Gastroenterology

## 2017-10-05 ENCOUNTER — Other Ambulatory Visit: Payer: Self-pay

## 2017-10-05 ENCOUNTER — Ambulatory Visit (AMBULATORY_SURGERY_CENTER): Payer: Self-pay

## 2017-10-05 VITALS — Ht 71.0 in | Wt 194.8 lb

## 2017-10-05 DIAGNOSIS — Z86010 Personal history of colonic polyps: Secondary | ICD-10-CM

## 2017-10-05 MED ORDER — NA SULFATE-K SULFATE-MG SULF 17.5-3.13-1.6 GM/177ML PO SOLN: 1.0000 | Freq: Once | ORAL | 0 refills | Status: AC

## 2017-10-05 NOTE — Progress Notes (Signed)
Denies allergies to eggs or soy products. Denies complication of anesthesia or sedation. Denies use of weight loss medication. Denies use of O2.   Emmi instructions declined.  

## 2017-10-13 DIAGNOSIS — E669 Obesity, unspecified: Secondary | ICD-10-CM | POA: Diagnosis not present

## 2017-10-13 DIAGNOSIS — E119 Type 2 diabetes mellitus without complications: Secondary | ICD-10-CM | POA: Diagnosis not present

## 2017-10-13 DIAGNOSIS — I10 Essential (primary) hypertension: Secondary | ICD-10-CM | POA: Diagnosis not present

## 2017-10-19 ENCOUNTER — Ambulatory Visit (AMBULATORY_SURGERY_CENTER): Payer: Medicare Other | Admitting: Gastroenterology

## 2017-10-19 ENCOUNTER — Encounter: Payer: Self-pay | Admitting: Gastroenterology

## 2017-10-19 VITALS — BP 122/67 | HR 57 | Temp 98.0°F | Resp 10 | Ht 71.0 in | Wt 194.0 lb

## 2017-10-19 DIAGNOSIS — E669 Obesity, unspecified: Secondary | ICD-10-CM | POA: Diagnosis not present

## 2017-10-19 DIAGNOSIS — Z86010 Personal history of colonic polyps: Secondary | ICD-10-CM

## 2017-10-19 DIAGNOSIS — E119 Type 2 diabetes mellitus without complications: Secondary | ICD-10-CM | POA: Diagnosis not present

## 2017-10-19 DIAGNOSIS — I10 Essential (primary) hypertension: Secondary | ICD-10-CM | POA: Diagnosis not present

## 2017-10-19 MED ORDER — SODIUM CHLORIDE 0.9 % IV SOLN: 500.0000 mL | Freq: Once | INTRAVENOUS | Status: AC

## 2017-10-19 NOTE — Progress Notes (Signed)
Report to PACU, RN, vss, BBS= Clear.  

## 2017-10-19 NOTE — Op Note (Signed)
[facility] Patient Name: Richard Singleton Procedure Date: 10/19/2017 8:36 AM MRN: [id number] Endoscopist: Ladene Artist , MD Age: 71 Referring MD:  Date of Birth: 1945/08/03 Gender: Male Account #: [id number] Procedure:                Colonoscopy Indications:              Surveillance: Personal history of adenomatous                            polyps on last colonoscopy 3 years ago Medicines:                Monitored Anesthesia Care Procedure:                Pre-Anesthesia Assessment:                           - Prior to the procedure, a History and Physical                            was performed, and patient medications and                            allergies were reviewed. The patient's tolerance of                            previous anesthesia was also reviewed. The risks                            and benefits of the procedure and the sedation                            options and risks were discussed with the patient.                            All questions were answered, and informed consent                            was obtained. Prior Anticoagulants: The patient has                            taken no previous anticoagulant or antiplatelet                            agents. ASA Grade Assessment: II - A patient with                            mild systemic disease. After reviewing the risks                            and benefits, the patient was deemed in                            satisfactory condition to undergo the procedure.  After obtaining informed consent, the colonoscope                            was passed under direct vision. Throughout the                            procedure, the patient's blood pressure, pulse, and                            oxygen saturations were monitored continuously. The                            Colonoscope was introduced through the anus and                            advanced to the the cecum,  identified by                            appendiceal orifice and ileocecal valve. The                            ileocecal valve, appendiceal orifice, and rectum                            were photographed. The quality of the bowel                            preparation was adequate. The colonoscopy was                            performed without difficulty. The patient tolerated                            the procedure well. Scope In: 8:44:59 AM Scope Out: 9:04:00 AM Scope Withdrawal Time: 0 hours 15 minutes 26 seconds  Total Procedure Duration: 0 hours 19 minutes 1 second  Findings:                 The perianal and digital rectal examinations were                            normal.                           Multiple medium-mouthed diverticula were found in                            the left colon. There was no evidence of                            diverticular bleeding.                           The exam was otherwise without abnormality on  direct and retroflexion views. Complications:            No immediate complications. Estimated blood loss:                            None. Estimated Blood Loss:     Estimated blood loss: none. Impression:               - Moderate diverticulosis in the left colon. There                            was no evidence of diverticular bleeding.                           - The examination was otherwise normal on direct                            and retroflexion views.                           - No specimens collected. Recommendation:           - Repeat colonoscopy in 5 years for surveillance.                           - Patient has a contact number available for                            emergencies. The signs and symptoms of potential                            delayed complications were discussed with the                            patient. Return to normal activities tomorrow.                            Written  discharge instructions were provided to the                            patient.                           - High fiber diet.                           - Continue present medications. Ladene Artist, MD 10/19/2017 9:06:56 AM This report has been signed electronically.

## 2017-10-19 NOTE — Patient Instructions (Addendum)
YOU HAD AN ENDOSCOPIC PROCEDURE TODAY AT [facility]:   Refer to the procedure report that was given to you for any specific questions about what was found during the examination.  If the procedure report does not answer your questions, please call your gastroenterologist to clarify.  If you requested that your care partner not be given the details of your procedure findings, then the procedure report has been included in a sealed envelope for you to review at your convenience later.  YOU SHOULD EXPECT: Some feelings of bloating in the abdomen. Passage of more gas than usual.  Walking can help get rid of the air that was put into your GI tract during the procedure and reduce the bloating. If you had a lower endoscopy (such as a colonoscopy or flexible sigmoidoscopy) you may notice spotting of blood in your stool or on the toilet paper. If you underwent a bowel prep for your procedure, you may not have a normal bowel movement for a few days.  Please Note:  You might notice some irritation and congestion in your nose or some drainage.  This is from the oxygen used during your procedure.  There is no need for concern and it should clear up in a day or so.  SYMPTOMS TO REPORT IMMEDIATELY:   Following lower endoscopy (colonoscopy or flexible sigmoidoscopy):  Excessive amounts of blood in the stool  Significant tenderness or worsening of abdominal pains  Swelling of the abdomen that is new, acute  Fever of 100F or higher   For urgent or emergent issues, a gastroenterologist can be reached at any hour by calling [phone].   DIET:  We do recommend a small meal at first, but then you may proceed to your regular diet. Try to increase the fiber in your diet, and drink plenty of water. Drink plenty of fluids but you should avoid alcoholic beverages for 24 hours.  ACTIVITY:  You should plan to take it easy for the rest of today and you should NOT DRIVE or use heavy machinery until  tomorrow (because of the sedation medicines used during the test).    FOLLOW UP: Our staff will call the number listed on your records the next business day following your procedure to check on you and address any questions or concerns that you may have regarding the information given to you following your procedure. If we do not reach you, we will leave a message.  However, if you are feeling well and you are not experiencing any problems, there is no need to return our call.  We will assume that you have returned to your regular daily activities without incident.  You will need another colonoscopy in 5 years.  If any biopsies were taken you will be contacted by phone or by letter within the next 1-3 weeks.  Please call us at [phone] if you have not heard about the biopsies in 3 weeks.    SIGNATURES/CONFIDENTIALITY: You and/or your care partner have signed paperwork which will be entered into your electronic medical record.  These signatures attest to the fact that that the information above on your After Visit Summary has been reviewed and is understood.  Full responsibility of the confidentiality of this discharge information lies with you and/or your care-partner.

## 2017-10-19 NOTE — Progress Notes (Signed)
Pt's states no medical or surgical changes since previsit or office visit. 

## 2017-10-20 ENCOUNTER — Telehealth: Payer: Self-pay

## 2017-10-20 NOTE — Telephone Encounter (Signed)
  Follow up Call-  Call back number 10/19/2017  Post procedure Call Back phone  # [phone]  Permission to leave phone message Yes  Some recent data might be hidden     Patient questions:  Do you have a fever, pain , or abdominal swelling? No. Pain Score  0 *  Have you tolerated food without any problems? Yes.    Have you been able to return to your normal activities? Yes.    Do you have any questions about your discharge instructions: Diet   No. Medications  No. Follow up visit  No.  Do you have questions or concerns about your Care? No.  Actions: * If pain score is 4 or above: No action needed, pain <4.

## 2017-10-27 DIAGNOSIS — I10 Essential (primary) hypertension: Secondary | ICD-10-CM | POA: Diagnosis not present

## 2017-10-27 DIAGNOSIS — E7800 Pure hypercholesterolemia, unspecified: Secondary | ICD-10-CM | POA: Diagnosis not present

## 2017-10-27 DIAGNOSIS — E119 Type 2 diabetes mellitus without complications: Secondary | ICD-10-CM | POA: Diagnosis not present

## 2017-10-27 DIAGNOSIS — Z125 Encounter for screening for malignant neoplasm of prostate: Secondary | ICD-10-CM | POA: Diagnosis not present

## 2017-10-27 DIAGNOSIS — R82998 Other abnormal findings in urine: Secondary | ICD-10-CM | POA: Diagnosis not present

## 2017-11-10 DIAGNOSIS — I10 Essential (primary) hypertension: Secondary | ICD-10-CM | POA: Diagnosis not present

## 2017-11-10 DIAGNOSIS — J984 Other disorders of lung: Secondary | ICD-10-CM | POA: Diagnosis not present

## 2017-11-10 DIAGNOSIS — E119 Type 2 diabetes mellitus without complications: Secondary | ICD-10-CM | POA: Diagnosis not present

## 2017-11-10 DIAGNOSIS — Z6827 Body mass index (BMI) 27.0-27.9, adult: Secondary | ICD-10-CM | POA: Diagnosis not present

## 2017-11-10 DIAGNOSIS — Z1389 Encounter for screening for other disorder: Secondary | ICD-10-CM | POA: Diagnosis not present

## 2017-11-10 DIAGNOSIS — D126 Benign neoplasm of colon, unspecified: Secondary | ICD-10-CM | POA: Diagnosis not present

## 2017-11-10 DIAGNOSIS — N528 Other male erectile dysfunction: Secondary | ICD-10-CM | POA: Diagnosis not present

## 2017-11-10 DIAGNOSIS — Z87891 Personal history of nicotine dependence: Secondary | ICD-10-CM | POA: Diagnosis not present

## 2017-11-10 DIAGNOSIS — Z0000 Encounter for general adult medical examination without abnormal findings: Secondary | ICD-10-CM | POA: Diagnosis not present

## 2017-11-10 DIAGNOSIS — Z1212 Encounter for screening for malignant neoplasm of rectum: Secondary | ICD-10-CM | POA: Diagnosis not present

## 2017-11-10 DIAGNOSIS — E668 Other obesity: Secondary | ICD-10-CM | POA: Diagnosis not present

## 2017-11-10 DIAGNOSIS — M19011 Primary osteoarthritis, right shoulder: Secondary | ICD-10-CM | POA: Diagnosis not present

## 2017-11-10 DIAGNOSIS — E7800 Pure hypercholesterolemia, unspecified: Secondary | ICD-10-CM | POA: Diagnosis not present

## 2017-11-23 DIAGNOSIS — Z86018 Personal history of other benign neoplasm: Secondary | ICD-10-CM | POA: Diagnosis not present

## 2017-11-23 DIAGNOSIS — L219 Seborrheic dermatitis, unspecified: Secondary | ICD-10-CM | POA: Diagnosis not present

## 2017-11-23 DIAGNOSIS — L821 Other seborrheic keratosis: Secondary | ICD-10-CM | POA: Diagnosis not present

## 2018-01-10 DIAGNOSIS — Z23 Encounter for immunization: Secondary | ICD-10-CM | POA: Diagnosis not present

## 2018-01-13 DIAGNOSIS — E119 Type 2 diabetes mellitus without complications: Secondary | ICD-10-CM | POA: Diagnosis not present

## 2018-01-13 DIAGNOSIS — I10 Essential (primary) hypertension: Secondary | ICD-10-CM | POA: Diagnosis not present

## 2018-01-26 IMAGING — CT CT NECK W/ CM
2 of 4 series · 6 of 14 positions shown, 7 images · IV contrast (iopamidol)
Comparison: PET-CT 06/05/2005

CLINICAL DATA: Mandibular mass apparent for 1 week.

Creatinine was obtained on site at [HOSPITAL] at [HOSPITAL].
Results: Creatinine 0.9 mg/dL.
EXAM:
CT NECK WITH CONTRAST
TECHNIQUE: Multidetector CT imaging of the neck was performed using the
standard protocol following the bolus administration of intravenous
contrast.
CONTRAST:  75mL [id number] IOPAMIDOL ([id number]-AKK) INJECTION 61%

[Series 2: neck · axial · 0.54mm/px · z∈[+651,+783]mm · 3 of 134 slices shown]
[im 34/134  bone]
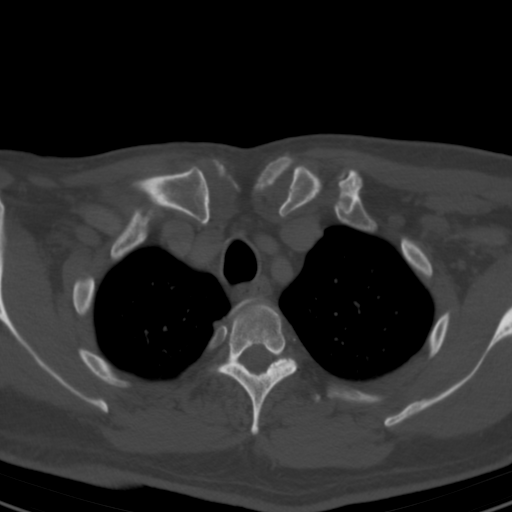
[im 67/134  bone]
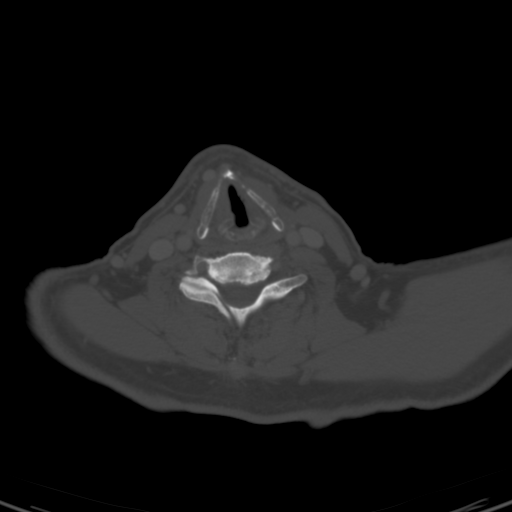
[im 100/134  bone]
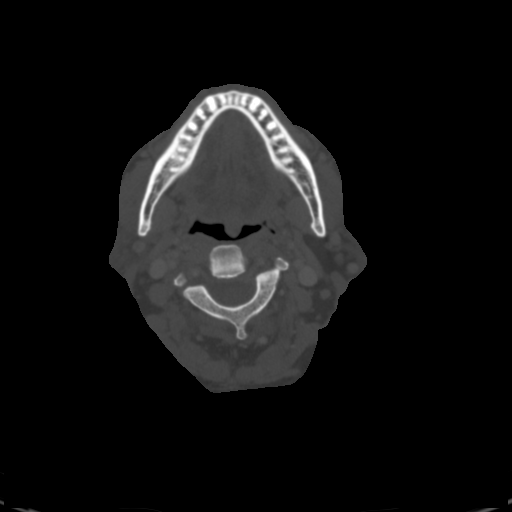

[Series 7: angled axial-oropharynx · axial · 0.51mm/px · z∈[+632,+761]mm · 3 of 134 slices shown, 4 images]
[im 34/134  soft-tissue]
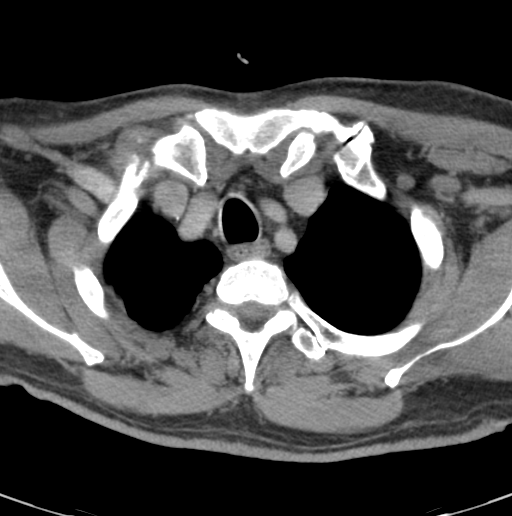
[im 34/134  bone]
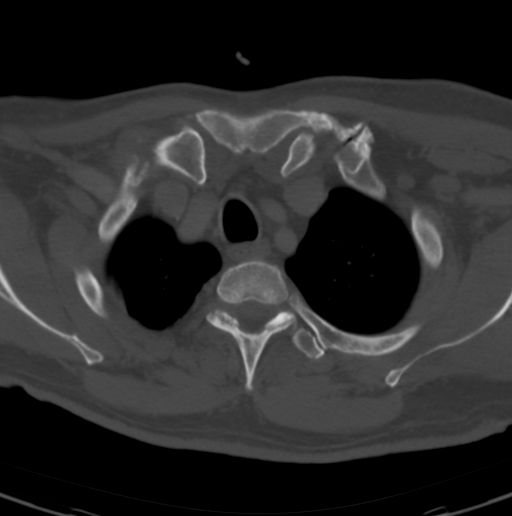
[im 67/134  bone]
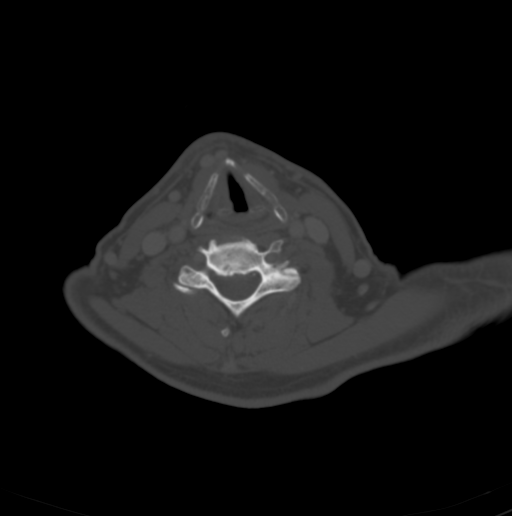
[im 100/134  bone]
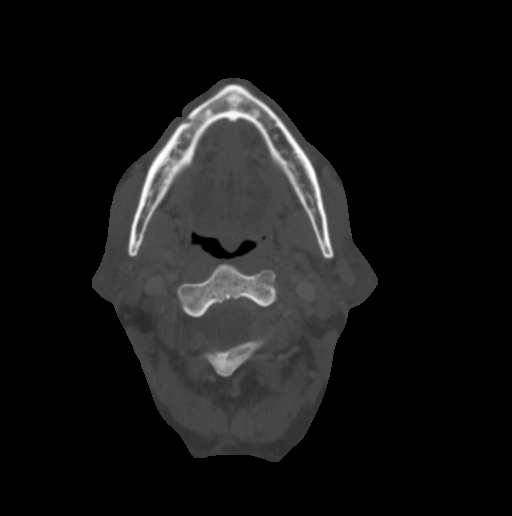

[6 of 14 positions shown; findings below may reference images not displayed]

FINDINGS: Pharynx and larynx: There is skin thickening and subcutaneous fat
reticulation in the submental region as well as more laterally in
the left greater than right lower face. There is asymmetric
thickening of the platysma on the left. Ill-defined soft tissue
thickening and stranding are noted along the left mylohyoid muscle
and anterior belly of the digastric muscle. No fluid collection or
discrete mass is identified. No periapical dental lucency is seen in
this region to clearly indicate dental infection. There is no
pharyngeal or laryngeal mass. The airway is widely patent.

Salivary glands: No evidence of parotid or submandibular mass,
stone, or inflammation. Left submandibular space inflammatory
changes are primarily located anteriorly.

Thyroid: Unremarkable.

Lymph nodes: No enlarged or suspicious lymph nodes in the neck.
Small bilateral submandibular and submental lymph nodes R 5 mm or
less in short axis.

Vascular: Major vascular structures of the neck are grossly patent.

Limited intracranial: Unremarkable.

Visualized orbits: Unremarkable.

Mastoids and visualized paranasal sinuses: Clear.

Skeleton: Moderate cervical facet arthrosis with grade 1
anterolisthesis of C4 on C5 and C7 on T1. Moderate disc degeneration
at C5-6 and C6-7.

Upper chest: Clear lung apices.

Other: 3.2 x 1.5 x 5.4 cm homogeneous fat density mass anterior to
the sternocleidomastoid muscle in the upper left neck, deep and
inferior to the parotid tail. This is either new or larger than on
the prior PET-CT.
IMPRESSION: 1. Subcutaneous and deep space inflammation/swelling in the
submental and primarily left submandibular regions, nonspecific but
may reflect cellulitis. No abscess.
2. 5.4 cm left neck lipoma.

## 2018-04-14 DIAGNOSIS — Z6828 Body mass index (BMI) 28.0-28.9, adult: Secondary | ICD-10-CM | POA: Diagnosis not present

## 2018-04-14 DIAGNOSIS — E119 Type 2 diabetes mellitus without complications: Secondary | ICD-10-CM | POA: Diagnosis not present

## 2018-04-14 DIAGNOSIS — I10 Essential (primary) hypertension: Secondary | ICD-10-CM | POA: Diagnosis not present

## 2018-05-19 DIAGNOSIS — Z87891 Personal history of nicotine dependence: Secondary | ICD-10-CM | POA: Diagnosis not present

## 2018-05-19 DIAGNOSIS — Z6828 Body mass index (BMI) 28.0-28.9, adult: Secondary | ICD-10-CM | POA: Diagnosis not present

## 2018-05-19 DIAGNOSIS — E7800 Pure hypercholesterolemia, unspecified: Secondary | ICD-10-CM | POA: Diagnosis not present

## 2018-05-19 DIAGNOSIS — D126 Benign neoplasm of colon, unspecified: Secondary | ICD-10-CM | POA: Diagnosis not present

## 2018-05-19 DIAGNOSIS — E119 Type 2 diabetes mellitus without complications: Secondary | ICD-10-CM | POA: Diagnosis not present

## 2018-05-19 DIAGNOSIS — E663 Overweight: Secondary | ICD-10-CM | POA: Diagnosis not present

## 2018-05-19 DIAGNOSIS — I10 Essential (primary) hypertension: Secondary | ICD-10-CM | POA: Diagnosis not present

## 2018-07-13 DIAGNOSIS — I10 Essential (primary) hypertension: Secondary | ICD-10-CM | POA: Diagnosis not present

## 2018-07-13 DIAGNOSIS — E119 Type 2 diabetes mellitus without complications: Secondary | ICD-10-CM | POA: Diagnosis not present

## 2018-10-12 DIAGNOSIS — I10 Essential (primary) hypertension: Secondary | ICD-10-CM | POA: Diagnosis not present

## 2018-10-12 DIAGNOSIS — E119 Type 2 diabetes mellitus without complications: Secondary | ICD-10-CM | POA: Diagnosis not present

## 2018-11-03 DIAGNOSIS — E119 Type 2 diabetes mellitus without complications: Secondary | ICD-10-CM | POA: Diagnosis not present

## 2018-11-04 DIAGNOSIS — R82998 Other abnormal findings in urine: Secondary | ICD-10-CM | POA: Diagnosis not present

## 2018-11-04 DIAGNOSIS — E7800 Pure hypercholesterolemia, unspecified: Secondary | ICD-10-CM | POA: Diagnosis not present

## 2018-11-04 DIAGNOSIS — Z125 Encounter for screening for malignant neoplasm of prostate: Secondary | ICD-10-CM | POA: Diagnosis not present

## 2018-11-04 DIAGNOSIS — E119 Type 2 diabetes mellitus without complications: Secondary | ICD-10-CM | POA: Diagnosis not present

## 2018-11-16 DIAGNOSIS — B001 Herpesviral vesicular dermatitis: Secondary | ICD-10-CM | POA: Diagnosis not present

## 2018-11-16 DIAGNOSIS — Z0000 Encounter for general adult medical examination without abnormal findings: Secondary | ICD-10-CM | POA: Diagnosis not present

## 2018-11-16 DIAGNOSIS — J984 Other disorders of lung: Secondary | ICD-10-CM | POA: Diagnosis not present

## 2018-11-16 DIAGNOSIS — Z87891 Personal history of nicotine dependence: Secondary | ICD-10-CM | POA: Diagnosis not present

## 2018-11-16 DIAGNOSIS — D126 Benign neoplasm of colon, unspecified: Secondary | ICD-10-CM | POA: Diagnosis not present

## 2018-11-16 DIAGNOSIS — Z1339 Encounter for screening examination for other mental health and behavioral disorders: Secondary | ICD-10-CM | POA: Diagnosis not present

## 2018-11-16 DIAGNOSIS — E119 Type 2 diabetes mellitus without complications: Secondary | ICD-10-CM | POA: Diagnosis not present

## 2018-11-16 DIAGNOSIS — E7800 Pure hypercholesterolemia, unspecified: Secondary | ICD-10-CM | POA: Diagnosis not present

## 2018-11-16 DIAGNOSIS — E663 Overweight: Secondary | ICD-10-CM | POA: Diagnosis not present

## 2018-11-16 DIAGNOSIS — I10 Essential (primary) hypertension: Secondary | ICD-10-CM | POA: Diagnosis not present

## 2018-11-16 DIAGNOSIS — N529 Male erectile dysfunction, unspecified: Secondary | ICD-10-CM | POA: Diagnosis not present

## 2018-11-16 DIAGNOSIS — M19011 Primary osteoarthritis, right shoulder: Secondary | ICD-10-CM | POA: Diagnosis not present

## 2018-12-01 DIAGNOSIS — L821 Other seborrheic keratosis: Secondary | ICD-10-CM | POA: Diagnosis not present

## 2018-12-01 DIAGNOSIS — W57XXXA Bitten or stung by nonvenomous insect and other nonvenomous arthropods, initial encounter: Secondary | ICD-10-CM | POA: Diagnosis not present

## 2018-12-01 DIAGNOSIS — L570 Actinic keratosis: Secondary | ICD-10-CM | POA: Diagnosis not present

## 2018-12-01 DIAGNOSIS — D170 Benign lipomatous neoplasm of skin and subcutaneous tissue of head, face and neck: Secondary | ICD-10-CM | POA: Diagnosis not present

## 2018-12-01 DIAGNOSIS — L723 Sebaceous cyst: Secondary | ICD-10-CM | POA: Diagnosis not present

## 2018-12-01 DIAGNOSIS — Z86018 Personal history of other benign neoplasm: Secondary | ICD-10-CM | POA: Diagnosis not present

## 2018-12-16 DIAGNOSIS — Z23 Encounter for immunization: Secondary | ICD-10-CM | POA: Diagnosis not present

## 2019-01-17 DIAGNOSIS — E119 Type 2 diabetes mellitus without complications: Secondary | ICD-10-CM | POA: Diagnosis not present

## 2019-01-17 DIAGNOSIS — I10 Essential (primary) hypertension: Secondary | ICD-10-CM | POA: Diagnosis not present

## 2019-04-19 DIAGNOSIS — E119 Type 2 diabetes mellitus without complications: Secondary | ICD-10-CM | POA: Diagnosis not present

## 2019-04-19 DIAGNOSIS — I10 Essential (primary) hypertension: Secondary | ICD-10-CM | POA: Diagnosis not present

## 2019-05-22 DIAGNOSIS — M19011 Primary osteoarthritis, right shoulder: Secondary | ICD-10-CM | POA: Diagnosis not present

## 2019-05-22 DIAGNOSIS — D126 Benign neoplasm of colon, unspecified: Secondary | ICD-10-CM | POA: Diagnosis not present

## 2019-05-22 DIAGNOSIS — I10 Essential (primary) hypertension: Secondary | ICD-10-CM | POA: Diagnosis not present

## 2019-05-22 DIAGNOSIS — E663 Overweight: Secondary | ICD-10-CM | POA: Diagnosis not present

## 2019-05-22 DIAGNOSIS — E7800 Pure hypercholesterolemia, unspecified: Secondary | ICD-10-CM | POA: Diagnosis not present

## 2019-05-22 DIAGNOSIS — Z1331 Encounter for screening for depression: Secondary | ICD-10-CM | POA: Diagnosis not present

## 2019-05-22 DIAGNOSIS — N529 Male erectile dysfunction, unspecified: Secondary | ICD-10-CM | POA: Diagnosis not present

## 2019-05-22 DIAGNOSIS — E119 Type 2 diabetes mellitus without complications: Secondary | ICD-10-CM | POA: Diagnosis not present

## 2019-07-20 DIAGNOSIS — I10 Essential (primary) hypertension: Secondary | ICD-10-CM | POA: Diagnosis not present

## 2019-07-20 DIAGNOSIS — E119 Type 2 diabetes mellitus without complications: Secondary | ICD-10-CM | POA: Diagnosis not present

## 2019-08-19 DIAGNOSIS — S90852A Superficial foreign body, left foot, initial encounter: Secondary | ICD-10-CM | POA: Diagnosis not present

## 2019-11-09 DIAGNOSIS — E119 Type 2 diabetes mellitus without complications: Secondary | ICD-10-CM | POA: Diagnosis not present

## 2019-11-09 DIAGNOSIS — I10 Essential (primary) hypertension: Secondary | ICD-10-CM | POA: Diagnosis not present

## 2019-11-17 DIAGNOSIS — E119 Type 2 diabetes mellitus without complications: Secondary | ICD-10-CM | POA: Diagnosis not present

## 2019-11-17 DIAGNOSIS — E7800 Pure hypercholesterolemia, unspecified: Secondary | ICD-10-CM | POA: Diagnosis not present

## 2019-11-17 DIAGNOSIS — Z125 Encounter for screening for malignant neoplasm of prostate: Secondary | ICD-10-CM | POA: Diagnosis not present

## 2019-11-23 DIAGNOSIS — N529 Male erectile dysfunction, unspecified: Secondary | ICD-10-CM | POA: Diagnosis not present

## 2019-11-23 DIAGNOSIS — E7800 Pure hypercholesterolemia, unspecified: Secondary | ICD-10-CM | POA: Diagnosis not present

## 2019-11-23 DIAGNOSIS — Z0000 Encounter for general adult medical examination without abnormal findings: Secondary | ICD-10-CM | POA: Diagnosis not present

## 2019-11-23 DIAGNOSIS — E663 Overweight: Secondary | ICD-10-CM | POA: Diagnosis not present

## 2019-11-23 DIAGNOSIS — Z87891 Personal history of nicotine dependence: Secondary | ICD-10-CM | POA: Diagnosis not present

## 2019-11-23 DIAGNOSIS — M542 Cervicalgia: Secondary | ICD-10-CM | POA: Diagnosis not present

## 2019-11-23 DIAGNOSIS — J984 Other disorders of lung: Secondary | ICD-10-CM | POA: Diagnosis not present

## 2019-11-23 DIAGNOSIS — R82998 Other abnormal findings in urine: Secondary | ICD-10-CM | POA: Diagnosis not present

## 2019-11-23 DIAGNOSIS — Z1212 Encounter for screening for malignant neoplasm of rectum: Secondary | ICD-10-CM | POA: Diagnosis not present

## 2019-11-23 DIAGNOSIS — M19011 Primary osteoarthritis, right shoulder: Secondary | ICD-10-CM | POA: Diagnosis not present

## 2019-11-23 DIAGNOSIS — E119 Type 2 diabetes mellitus without complications: Secondary | ICD-10-CM | POA: Diagnosis not present

## 2019-11-23 DIAGNOSIS — I10 Essential (primary) hypertension: Secondary | ICD-10-CM | POA: Diagnosis not present

## 2019-11-23 DIAGNOSIS — D126 Benign neoplasm of colon, unspecified: Secondary | ICD-10-CM | POA: Diagnosis not present

## 2019-12-01 DIAGNOSIS — E119 Type 2 diabetes mellitus without complications: Secondary | ICD-10-CM | POA: Diagnosis not present

## 2019-12-20 DIAGNOSIS — L723 Sebaceous cyst: Secondary | ICD-10-CM | POA: Diagnosis not present

## 2019-12-20 DIAGNOSIS — L570 Actinic keratosis: Secondary | ICD-10-CM | POA: Diagnosis not present

## 2019-12-20 DIAGNOSIS — Z86018 Personal history of other benign neoplasm: Secondary | ICD-10-CM | POA: Diagnosis not present

## 2019-12-20 DIAGNOSIS — L578 Other skin changes due to chronic exposure to nonionizing radiation: Secondary | ICD-10-CM | POA: Diagnosis not present

## 2019-12-20 DIAGNOSIS — D225 Melanocytic nevi of trunk: Secondary | ICD-10-CM | POA: Diagnosis not present

## 2019-12-20 DIAGNOSIS — L821 Other seborrheic keratosis: Secondary | ICD-10-CM | POA: Diagnosis not present

## 2019-12-20 DIAGNOSIS — D170 Benign lipomatous neoplasm of skin and subcutaneous tissue of head, face and neck: Secondary | ICD-10-CM | POA: Diagnosis not present

## 2020-01-10 DIAGNOSIS — Z23 Encounter for immunization: Secondary | ICD-10-CM | POA: Diagnosis not present

## 2020-01-16 DIAGNOSIS — Z23 Encounter for immunization: Secondary | ICD-10-CM | POA: Diagnosis not present

## 2020-02-15 DIAGNOSIS — E119 Type 2 diabetes mellitus without complications: Secondary | ICD-10-CM | POA: Diagnosis not present

## 2020-02-15 DIAGNOSIS — I10 Essential (primary) hypertension: Secondary | ICD-10-CM | POA: Diagnosis not present

## 2020-02-15 DIAGNOSIS — R0789 Other chest pain: Secondary | ICD-10-CM | POA: Diagnosis not present

## 2020-02-16 DIAGNOSIS — M5410 Radiculopathy, site unspecified: Secondary | ICD-10-CM | POA: Diagnosis not present

## 2020-02-16 DIAGNOSIS — E119 Type 2 diabetes mellitus without complications: Secondary | ICD-10-CM | POA: Diagnosis not present

## 2020-03-12 DIAGNOSIS — M549 Dorsalgia, unspecified: Secondary | ICD-10-CM | POA: Diagnosis not present

## 2020-03-20 DIAGNOSIS — L570 Actinic keratosis: Secondary | ICD-10-CM | POA: Diagnosis not present

## 2020-04-30 DIAGNOSIS — M25551 Pain in right hip: Secondary | ICD-10-CM | POA: Diagnosis not present

## 2020-05-08 DIAGNOSIS — M25559 Pain in unspecified hip: Secondary | ICD-10-CM | POA: Diagnosis not present

## 2020-05-28 DIAGNOSIS — E119 Type 2 diabetes mellitus without complications: Secondary | ICD-10-CM | POA: Diagnosis not present

## 2020-05-28 DIAGNOSIS — I10 Essential (primary) hypertension: Secondary | ICD-10-CM | POA: Diagnosis not present

## 2020-06-18 DIAGNOSIS — L570 Actinic keratosis: Secondary | ICD-10-CM | POA: Diagnosis not present

## 2020-06-25 DIAGNOSIS — M7061 Trochanteric bursitis, right hip: Secondary | ICD-10-CM | POA: Diagnosis not present

## 2020-06-25 DIAGNOSIS — S76311D Strain of muscle, fascia and tendon of the posterior muscle group at thigh level, right thigh, subsequent encounter: Secondary | ICD-10-CM | POA: Diagnosis not present

## 2020-07-22 DIAGNOSIS — Z23 Encounter for immunization: Secondary | ICD-10-CM | POA: Diagnosis not present

## 2020-08-02 DIAGNOSIS — M7061 Trochanteric bursitis, right hip: Secondary | ICD-10-CM | POA: Diagnosis not present

## 2020-08-02 DIAGNOSIS — S76311D Strain of muscle, fascia and tendon of the posterior muscle group at thigh level, right thigh, subsequent encounter: Secondary | ICD-10-CM | POA: Diagnosis not present

## 2020-08-02 DIAGNOSIS — M25551 Pain in right hip: Secondary | ICD-10-CM | POA: Diagnosis not present

## 2020-08-26 DIAGNOSIS — M25551 Pain in right hip: Secondary | ICD-10-CM | POA: Diagnosis not present

## 2020-09-03 DIAGNOSIS — S76311A Strain of muscle, fascia and tendon of the posterior muscle group at thigh level, right thigh, initial encounter: Secondary | ICD-10-CM | POA: Diagnosis not present

## 2020-09-10 DIAGNOSIS — E119 Type 2 diabetes mellitus without complications: Secondary | ICD-10-CM | POA: Diagnosis not present

## 2020-09-10 DIAGNOSIS — E7800 Pure hypercholesterolemia, unspecified: Secondary | ICD-10-CM | POA: Diagnosis not present

## 2020-09-10 DIAGNOSIS — I10 Essential (primary) hypertension: Secondary | ICD-10-CM | POA: Diagnosis not present

## 2020-09-30 DIAGNOSIS — S76311D Strain of muscle, fascia and tendon of the posterior muscle group at thigh level, right thigh, subsequent encounter: Secondary | ICD-10-CM | POA: Diagnosis not present

## 2020-10-29 DIAGNOSIS — S76311D Strain of muscle, fascia and tendon of the posterior muscle group at thigh level, right thigh, subsequent encounter: Secondary | ICD-10-CM | POA: Diagnosis not present

## 2020-10-29 DIAGNOSIS — M7061 Trochanteric bursitis, right hip: Secondary | ICD-10-CM | POA: Diagnosis not present

## 2020-12-02 DIAGNOSIS — Z7984 Long term (current) use of oral hypoglycemic drugs: Secondary | ICD-10-CM | POA: Diagnosis not present

## 2020-12-02 DIAGNOSIS — E119 Type 2 diabetes mellitus without complications: Secondary | ICD-10-CM | POA: Diagnosis not present

## 2020-12-02 DIAGNOSIS — H2513 Age-related nuclear cataract, bilateral: Secondary | ICD-10-CM | POA: Diagnosis not present

## 2020-12-06 DIAGNOSIS — Z125 Encounter for screening for malignant neoplasm of prostate: Secondary | ICD-10-CM | POA: Diagnosis not present

## 2020-12-06 DIAGNOSIS — E119 Type 2 diabetes mellitus without complications: Secondary | ICD-10-CM | POA: Diagnosis not present

## 2020-12-06 DIAGNOSIS — E7800 Pure hypercholesterolemia, unspecified: Secondary | ICD-10-CM | POA: Diagnosis not present

## 2020-12-13 DIAGNOSIS — Z1339 Encounter for screening examination for other mental health and behavioral disorders: Secondary | ICD-10-CM | POA: Diagnosis not present

## 2020-12-13 DIAGNOSIS — E119 Type 2 diabetes mellitus without complications: Secondary | ICD-10-CM | POA: Diagnosis not present

## 2020-12-13 DIAGNOSIS — M19011 Primary osteoarthritis, right shoulder: Secondary | ICD-10-CM | POA: Diagnosis not present

## 2020-12-13 DIAGNOSIS — E663 Overweight: Secondary | ICD-10-CM | POA: Diagnosis not present

## 2020-12-13 DIAGNOSIS — Z0000 Encounter for general adult medical examination without abnormal findings: Secondary | ICD-10-CM | POA: Diagnosis not present

## 2020-12-13 DIAGNOSIS — Z1331 Encounter for screening for depression: Secondary | ICD-10-CM | POA: Diagnosis not present

## 2020-12-13 DIAGNOSIS — Z23 Encounter for immunization: Secondary | ICD-10-CM | POA: Diagnosis not present

## 2020-12-13 DIAGNOSIS — J984 Other disorders of lung: Secondary | ICD-10-CM | POA: Diagnosis not present

## 2020-12-13 DIAGNOSIS — N529 Male erectile dysfunction, unspecified: Secondary | ICD-10-CM | POA: Diagnosis not present

## 2020-12-13 DIAGNOSIS — Z87891 Personal history of nicotine dependence: Secondary | ICD-10-CM | POA: Diagnosis not present

## 2020-12-13 DIAGNOSIS — I10 Essential (primary) hypertension: Secondary | ICD-10-CM | POA: Diagnosis not present

## 2020-12-13 DIAGNOSIS — E7800 Pure hypercholesterolemia, unspecified: Secondary | ICD-10-CM | POA: Diagnosis not present

## 2020-12-24 DIAGNOSIS — D170 Benign lipomatous neoplasm of skin and subcutaneous tissue of head, face and neck: Secondary | ICD-10-CM | POA: Diagnosis not present

## 2020-12-24 DIAGNOSIS — Z86018 Personal history of other benign neoplasm: Secondary | ICD-10-CM | POA: Diagnosis not present

## 2020-12-24 DIAGNOSIS — Z23 Encounter for immunization: Secondary | ICD-10-CM | POA: Diagnosis not present

## 2020-12-24 DIAGNOSIS — L578 Other skin changes due to chronic exposure to nonionizing radiation: Secondary | ICD-10-CM | POA: Diagnosis not present

## 2020-12-24 DIAGNOSIS — D225 Melanocytic nevi of trunk: Secondary | ICD-10-CM | POA: Diagnosis not present

## 2020-12-24 DIAGNOSIS — L738 Other specified follicular disorders: Secondary | ICD-10-CM | POA: Diagnosis not present

## 2020-12-24 DIAGNOSIS — L723 Sebaceous cyst: Secondary | ICD-10-CM | POA: Diagnosis not present

## 2020-12-24 DIAGNOSIS — L570 Actinic keratosis: Secondary | ICD-10-CM | POA: Diagnosis not present

## 2020-12-24 DIAGNOSIS — L821 Other seborrheic keratosis: Secondary | ICD-10-CM | POA: Diagnosis not present

## 2021-01-23 DIAGNOSIS — E119 Type 2 diabetes mellitus without complications: Secondary | ICD-10-CM | POA: Diagnosis not present

## 2021-01-23 DIAGNOSIS — E7800 Pure hypercholesterolemia, unspecified: Secondary | ICD-10-CM | POA: Diagnosis not present

## 2021-01-23 DIAGNOSIS — I10 Essential (primary) hypertension: Secondary | ICD-10-CM | POA: Diagnosis not present

## 2021-06-03 DIAGNOSIS — R059 Cough, unspecified: Secondary | ICD-10-CM | POA: Diagnosis not present

## 2021-06-03 DIAGNOSIS — I10 Essential (primary) hypertension: Secondary | ICD-10-CM | POA: Diagnosis not present

## 2021-06-03 DIAGNOSIS — E119 Type 2 diabetes mellitus without complications: Secondary | ICD-10-CM | POA: Diagnosis not present

## 2021-06-03 DIAGNOSIS — E7800 Pure hypercholesterolemia, unspecified: Secondary | ICD-10-CM | POA: Diagnosis not present

## 2021-06-16 DIAGNOSIS — E663 Overweight: Secondary | ICD-10-CM | POA: Diagnosis not present

## 2021-06-16 DIAGNOSIS — Z1339 Encounter for screening examination for other mental health and behavioral disorders: Secondary | ICD-10-CM | POA: Diagnosis not present

## 2021-06-16 DIAGNOSIS — E7800 Pure hypercholesterolemia, unspecified: Secondary | ICD-10-CM | POA: Diagnosis not present

## 2021-06-16 DIAGNOSIS — Z87891 Personal history of nicotine dependence: Secondary | ICD-10-CM | POA: Diagnosis not present

## 2021-06-16 DIAGNOSIS — J984 Other disorders of lung: Secondary | ICD-10-CM | POA: Diagnosis not present

## 2021-06-16 DIAGNOSIS — E119 Type 2 diabetes mellitus without complications: Secondary | ICD-10-CM | POA: Diagnosis not present

## 2021-06-16 DIAGNOSIS — Z1331 Encounter for screening for depression: Secondary | ICD-10-CM | POA: Diagnosis not present

## 2021-06-16 DIAGNOSIS — M19011 Primary osteoarthritis, right shoulder: Secondary | ICD-10-CM | POA: Diagnosis not present

## 2021-06-16 DIAGNOSIS — I10 Essential (primary) hypertension: Secondary | ICD-10-CM | POA: Diagnosis not present

## 2021-09-16 DIAGNOSIS — E119 Type 2 diabetes mellitus without complications: Secondary | ICD-10-CM | POA: Diagnosis not present

## 2021-09-16 DIAGNOSIS — E7800 Pure hypercholesterolemia, unspecified: Secondary | ICD-10-CM | POA: Diagnosis not present

## 2021-09-16 DIAGNOSIS — I10 Essential (primary) hypertension: Secondary | ICD-10-CM | POA: Diagnosis not present

## 2021-09-16 DIAGNOSIS — R059 Cough, unspecified: Secondary | ICD-10-CM | POA: Diagnosis not present

## 2021-09-16 DIAGNOSIS — E663 Overweight: Secondary | ICD-10-CM | POA: Diagnosis not present

## 2021-12-30 DIAGNOSIS — L821 Other seborrheic keratosis: Secondary | ICD-10-CM | POA: Diagnosis not present

## 2021-12-30 DIAGNOSIS — L720 Epidermal cyst: Secondary | ICD-10-CM | POA: Diagnosis not present

## 2021-12-30 DIAGNOSIS — L578 Other skin changes due to chronic exposure to nonionizing radiation: Secondary | ICD-10-CM | POA: Diagnosis not present

## 2021-12-30 DIAGNOSIS — Z86018 Personal history of other benign neoplasm: Secondary | ICD-10-CM | POA: Diagnosis not present

## 2021-12-30 DIAGNOSIS — L814 Other melanin hyperpigmentation: Secondary | ICD-10-CM | POA: Diagnosis not present

## 2021-12-30 DIAGNOSIS — L570 Actinic keratosis: Secondary | ICD-10-CM | POA: Diagnosis not present

## 2021-12-30 DIAGNOSIS — D225 Melanocytic nevi of trunk: Secondary | ICD-10-CM | POA: Diagnosis not present

## 2022-01-06 DIAGNOSIS — H5213 Myopia, bilateral: Secondary | ICD-10-CM | POA: Diagnosis not present

## 2022-01-06 DIAGNOSIS — E119 Type 2 diabetes mellitus without complications: Secondary | ICD-10-CM | POA: Diagnosis not present

## 2022-01-07 DIAGNOSIS — E7800 Pure hypercholesterolemia, unspecified: Secondary | ICD-10-CM | POA: Diagnosis not present

## 2022-01-07 DIAGNOSIS — I10 Essential (primary) hypertension: Secondary | ICD-10-CM | POA: Diagnosis not present

## 2022-01-07 DIAGNOSIS — E119 Type 2 diabetes mellitus without complications: Secondary | ICD-10-CM | POA: Diagnosis not present

## 2022-01-07 DIAGNOSIS — Z125 Encounter for screening for malignant neoplasm of prostate: Secondary | ICD-10-CM | POA: Diagnosis not present

## 2022-01-07 DIAGNOSIS — R7989 Other specified abnormal findings of blood chemistry: Secondary | ICD-10-CM | POA: Diagnosis not present

## 2022-01-14 DIAGNOSIS — Z1212 Encounter for screening for malignant neoplasm of rectum: Secondary | ICD-10-CM | POA: Diagnosis not present

## 2022-01-14 DIAGNOSIS — I10 Essential (primary) hypertension: Secondary | ICD-10-CM | POA: Diagnosis not present

## 2022-01-14 DIAGNOSIS — E119 Type 2 diabetes mellitus without complications: Secondary | ICD-10-CM | POA: Diagnosis not present

## 2022-01-14 DIAGNOSIS — Z87891 Personal history of nicotine dependence: Secondary | ICD-10-CM | POA: Diagnosis not present

## 2022-01-14 DIAGNOSIS — R82998 Other abnormal findings in urine: Secondary | ICD-10-CM | POA: Diagnosis not present

## 2022-01-14 DIAGNOSIS — Z0000 Encounter for general adult medical examination without abnormal findings: Secondary | ICD-10-CM | POA: Diagnosis not present

## 2022-01-14 DIAGNOSIS — E7800 Pure hypercholesterolemia, unspecified: Secondary | ICD-10-CM | POA: Diagnosis not present

## 2022-01-14 DIAGNOSIS — D126 Benign neoplasm of colon, unspecified: Secondary | ICD-10-CM | POA: Diagnosis not present

## 2022-01-14 DIAGNOSIS — M19011 Primary osteoarthritis, right shoulder: Secondary | ICD-10-CM | POA: Diagnosis not present

## 2022-01-14 DIAGNOSIS — Z1331 Encounter for screening for depression: Secondary | ICD-10-CM | POA: Diagnosis not present

## 2022-01-14 DIAGNOSIS — Z1339 Encounter for screening examination for other mental health and behavioral disorders: Secondary | ICD-10-CM | POA: Diagnosis not present

## 2022-01-14 DIAGNOSIS — Z23 Encounter for immunization: Secondary | ICD-10-CM | POA: Diagnosis not present

## 2022-01-14 DIAGNOSIS — E663 Overweight: Secondary | ICD-10-CM | POA: Diagnosis not present

## 2022-01-14 DIAGNOSIS — N529 Male erectile dysfunction, unspecified: Secondary | ICD-10-CM | POA: Diagnosis not present

## 2022-01-16 DIAGNOSIS — Z23 Encounter for immunization: Secondary | ICD-10-CM | POA: Diagnosis not present

## 2022-03-24 DIAGNOSIS — E7800 Pure hypercholesterolemia, unspecified: Secondary | ICD-10-CM | POA: Diagnosis not present

## 2022-03-24 DIAGNOSIS — I10 Essential (primary) hypertension: Secondary | ICD-10-CM | POA: Diagnosis not present

## 2022-03-24 DIAGNOSIS — E119 Type 2 diabetes mellitus without complications: Secondary | ICD-10-CM | POA: Diagnosis not present

## 2022-09-02 ENCOUNTER — Encounter: Payer: Self-pay | Admitting: Gastroenterology

## 2023-01-05 DIAGNOSIS — L578 Other skin changes due to chronic exposure to nonionizing radiation: Secondary | ICD-10-CM | POA: Diagnosis not present

## 2023-01-05 DIAGNOSIS — Z86018 Personal history of other benign neoplasm: Secondary | ICD-10-CM | POA: Diagnosis not present

## 2023-01-05 DIAGNOSIS — L720 Epidermal cyst: Secondary | ICD-10-CM | POA: Diagnosis not present

## 2023-01-05 DIAGNOSIS — D225 Melanocytic nevi of trunk: Secondary | ICD-10-CM | POA: Diagnosis not present

## 2023-01-05 DIAGNOSIS — L570 Actinic keratosis: Secondary | ICD-10-CM | POA: Diagnosis not present

## 2023-01-05 DIAGNOSIS — L814 Other melanin hyperpigmentation: Secondary | ICD-10-CM | POA: Diagnosis not present

## 2023-01-05 DIAGNOSIS — L821 Other seborrheic keratosis: Secondary | ICD-10-CM | POA: Diagnosis not present

## 2023-01-15 DIAGNOSIS — E781 Pure hyperglyceridemia: Secondary | ICD-10-CM | POA: Diagnosis not present

## 2023-01-15 DIAGNOSIS — Z1389 Encounter for screening for other disorder: Secondary | ICD-10-CM | POA: Diagnosis not present

## 2023-01-15 DIAGNOSIS — I10 Essential (primary) hypertension: Secondary | ICD-10-CM | POA: Diagnosis not present

## 2023-01-15 DIAGNOSIS — E119 Type 2 diabetes mellitus without complications: Secondary | ICD-10-CM | POA: Diagnosis not present

## 2023-01-15 DIAGNOSIS — Z1212 Encounter for screening for malignant neoplasm of rectum: Secondary | ICD-10-CM | POA: Diagnosis not present

## 2023-01-18 DIAGNOSIS — E119 Type 2 diabetes mellitus without complications: Secondary | ICD-10-CM | POA: Diagnosis not present

## 2023-01-22 DIAGNOSIS — M19011 Primary osteoarthritis, right shoulder: Secondary | ICD-10-CM | POA: Diagnosis not present

## 2023-01-22 DIAGNOSIS — D126 Benign neoplasm of colon, unspecified: Secondary | ICD-10-CM | POA: Diagnosis not present

## 2023-01-22 DIAGNOSIS — E7800 Pure hypercholesterolemia, unspecified: Secondary | ICD-10-CM | POA: Diagnosis not present

## 2023-01-22 DIAGNOSIS — Z0000 Encounter for general adult medical examination without abnormal findings: Secondary | ICD-10-CM | POA: Diagnosis not present

## 2023-01-22 DIAGNOSIS — J984 Other disorders of lung: Secondary | ICD-10-CM | POA: Diagnosis not present

## 2023-01-22 DIAGNOSIS — N529 Male erectile dysfunction, unspecified: Secondary | ICD-10-CM | POA: Diagnosis not present

## 2023-01-22 DIAGNOSIS — I10 Essential (primary) hypertension: Secondary | ICD-10-CM | POA: Diagnosis not present

## 2023-01-22 DIAGNOSIS — E119 Type 2 diabetes mellitus without complications: Secondary | ICD-10-CM | POA: Diagnosis not present

## 2023-01-22 DIAGNOSIS — R82998 Other abnormal findings in urine: Secondary | ICD-10-CM | POA: Diagnosis not present

## 2023-01-22 DIAGNOSIS — E663 Overweight: Secondary | ICD-10-CM | POA: Diagnosis not present

## 2023-04-13 DIAGNOSIS — I10 Essential (primary) hypertension: Secondary | ICD-10-CM | POA: Diagnosis not present

## 2023-04-13 DIAGNOSIS — E119 Type 2 diabetes mellitus without complications: Secondary | ICD-10-CM | POA: Diagnosis not present

## 2023-04-13 DIAGNOSIS — Z7189 Other specified counseling: Secondary | ICD-10-CM | POA: Diagnosis not present

## 2023-04-13 DIAGNOSIS — E7800 Pure hypercholesterolemia, unspecified: Secondary | ICD-10-CM | POA: Diagnosis not present

## 2023-06-21 DIAGNOSIS — H43393 Other vitreous opacities, bilateral: Secondary | ICD-10-CM | POA: Diagnosis not present

## 2023-08-05 DIAGNOSIS — E119 Type 2 diabetes mellitus without complications: Secondary | ICD-10-CM | POA: Diagnosis not present

## 2023-08-05 DIAGNOSIS — J984 Other disorders of lung: Secondary | ICD-10-CM | POA: Diagnosis not present

## 2023-08-05 DIAGNOSIS — I10 Essential (primary) hypertension: Secondary | ICD-10-CM | POA: Diagnosis not present

## 2023-08-05 DIAGNOSIS — Z23 Encounter for immunization: Secondary | ICD-10-CM | POA: Diagnosis not present

## 2023-08-05 DIAGNOSIS — M19011 Primary osteoarthritis, right shoulder: Secondary | ICD-10-CM | POA: Diagnosis not present

## 2023-08-05 DIAGNOSIS — E7800 Pure hypercholesterolemia, unspecified: Secondary | ICD-10-CM | POA: Diagnosis not present

## 2023-08-05 DIAGNOSIS — N400 Enlarged prostate without lower urinary tract symptoms: Secondary | ICD-10-CM | POA: Diagnosis not present

## 2023-08-05 DIAGNOSIS — E663 Overweight: Secondary | ICD-10-CM | POA: Diagnosis not present

## 2023-09-28 DIAGNOSIS — E7800 Pure hypercholesterolemia, unspecified: Secondary | ICD-10-CM | POA: Diagnosis not present

## 2023-09-28 DIAGNOSIS — E119 Type 2 diabetes mellitus without complications: Secondary | ICD-10-CM | POA: Diagnosis not present

## 2023-09-28 DIAGNOSIS — I10 Essential (primary) hypertension: Secondary | ICD-10-CM | POA: Diagnosis not present

## 2024-01-14 DIAGNOSIS — L578 Other skin changes due to chronic exposure to nonionizing radiation: Secondary | ICD-10-CM | POA: Diagnosis not present

## 2024-01-14 DIAGNOSIS — D225 Melanocytic nevi of trunk: Secondary | ICD-10-CM | POA: Diagnosis not present

## 2024-01-14 DIAGNOSIS — L814 Other melanin hyperpigmentation: Secondary | ICD-10-CM | POA: Diagnosis not present

## 2024-01-14 DIAGNOSIS — H61009 Unspecified perichondritis of external ear, unspecified ear: Secondary | ICD-10-CM | POA: Diagnosis not present

## 2024-01-14 DIAGNOSIS — L821 Other seborrheic keratosis: Secondary | ICD-10-CM | POA: Diagnosis not present

## 2024-01-14 DIAGNOSIS — D485 Neoplasm of uncertain behavior of skin: Secondary | ICD-10-CM | POA: Diagnosis not present

## 2024-01-14 DIAGNOSIS — L570 Actinic keratosis: Secondary | ICD-10-CM | POA: Diagnosis not present

## 2024-01-14 DIAGNOSIS — L720 Epidermal cyst: Secondary | ICD-10-CM | POA: Diagnosis not present

## 2024-01-14 DIAGNOSIS — M67449 Ganglion, unspecified hand: Secondary | ICD-10-CM | POA: Diagnosis not present

## 2024-01-14 DIAGNOSIS — Z86018 Personal history of other benign neoplasm: Secondary | ICD-10-CM | POA: Diagnosis not present

## 2024-01-25 DIAGNOSIS — Z125 Encounter for screening for malignant neoplasm of prostate: Secondary | ICD-10-CM | POA: Diagnosis not present

## 2024-01-25 DIAGNOSIS — E7800 Pure hypercholesterolemia, unspecified: Secondary | ICD-10-CM | POA: Diagnosis not present

## 2024-01-25 DIAGNOSIS — Z1212 Encounter for screening for malignant neoplasm of rectum: Secondary | ICD-10-CM | POA: Diagnosis not present

## 2024-01-25 DIAGNOSIS — I10 Essential (primary) hypertension: Secondary | ICD-10-CM | POA: Diagnosis not present

## 2024-01-25 DIAGNOSIS — E119 Type 2 diabetes mellitus without complications: Secondary | ICD-10-CM | POA: Diagnosis not present

## 2024-02-01 DIAGNOSIS — E119 Type 2 diabetes mellitus without complications: Secondary | ICD-10-CM | POA: Diagnosis not present

## 2024-02-01 DIAGNOSIS — E7800 Pure hypercholesterolemia, unspecified: Secondary | ICD-10-CM | POA: Diagnosis not present

## 2024-02-01 DIAGNOSIS — Z0000 Encounter for general adult medical examination without abnormal findings: Secondary | ICD-10-CM | POA: Diagnosis not present

## 2024-02-01 DIAGNOSIS — J984 Other disorders of lung: Secondary | ICD-10-CM | POA: Diagnosis not present

## 2024-02-01 DIAGNOSIS — M19011 Primary osteoarthritis, right shoulder: Secondary | ICD-10-CM | POA: Diagnosis not present

## 2024-02-01 DIAGNOSIS — N529 Male erectile dysfunction, unspecified: Secondary | ICD-10-CM | POA: Diagnosis not present

## 2024-02-01 DIAGNOSIS — R82998 Other abnormal findings in urine: Secondary | ICD-10-CM | POA: Diagnosis not present

## 2024-02-01 DIAGNOSIS — Z1331 Encounter for screening for depression: Secondary | ICD-10-CM | POA: Diagnosis not present

## 2024-02-01 DIAGNOSIS — E663 Overweight: Secondary | ICD-10-CM | POA: Diagnosis not present

## 2024-02-01 DIAGNOSIS — Z1339 Encounter for screening examination for other mental health and behavioral disorders: Secondary | ICD-10-CM | POA: Diagnosis not present

## 2024-02-01 DIAGNOSIS — Z23 Encounter for immunization: Secondary | ICD-10-CM | POA: Diagnosis not present

## 2024-02-01 DIAGNOSIS — N400 Enlarged prostate without lower urinary tract symptoms: Secondary | ICD-10-CM | POA: Diagnosis not present

## 2024-02-01 DIAGNOSIS — I10 Essential (primary) hypertension: Secondary | ICD-10-CM | POA: Diagnosis not present
